# Patient Record
Sex: Female | Born: 1940
Health system: Southern US, Community
[De-identification: ages and names within clinical notes are randomized; demographics above are authoritative.]

## PROBLEM LIST (undated history)

## (undated) DIAGNOSIS — E7800 Pure hypercholesterolemia, unspecified: Secondary | ICD-10-CM

## (undated) DIAGNOSIS — Z87442 Personal history of urinary calculi: Secondary | ICD-10-CM

## (undated) DIAGNOSIS — J302 Other seasonal allergic rhinitis: Secondary | ICD-10-CM

## (undated) DIAGNOSIS — J45909 Unspecified asthma, uncomplicated: Secondary | ICD-10-CM

## (undated) DIAGNOSIS — Z9889 Other specified postprocedural states: Secondary | ICD-10-CM

## (undated) DIAGNOSIS — K589 Irritable bowel syndrome without diarrhea: Secondary | ICD-10-CM

## (undated) DIAGNOSIS — Z8489 Family history of other specified conditions: Secondary | ICD-10-CM

## (undated) DIAGNOSIS — M8580 Other specified disorders of bone density and structure, unspecified site: Secondary | ICD-10-CM

## (undated) DIAGNOSIS — I499 Cardiac arrhythmia, unspecified: Secondary | ICD-10-CM

## (undated) DIAGNOSIS — R112 Nausea with vomiting, unspecified: Secondary | ICD-10-CM

## (undated) DIAGNOSIS — D649 Anemia, unspecified: Secondary | ICD-10-CM

## (undated) DIAGNOSIS — G2581 Restless legs syndrome: Secondary | ICD-10-CM

## (undated) DIAGNOSIS — T8859XA Other complications of anesthesia, initial encounter: Secondary | ICD-10-CM

## (undated) DIAGNOSIS — M1990 Unspecified osteoarthritis, unspecified site: Secondary | ICD-10-CM

## (undated) DIAGNOSIS — G43909 Migraine, unspecified, not intractable, without status migrainosus: Secondary | ICD-10-CM

## (undated) HISTORY — PX: OTHER SURGICAL HISTORY: SHX169

## (undated) HISTORY — DX: Irritable bowel syndrome, unspecified: K58.9

## (undated) HISTORY — PX: APPENDECTOMY: SHX54

## (undated) HISTORY — DX: Pure hypercholesterolemia, unspecified: E78.00

## (undated) HISTORY — PX: EYE SURGERY: SHX253

## (undated) HISTORY — DX: Anemia, unspecified: D64.9

## (undated) HISTORY — PX: ABDOMINAL HYSTERECTOMY: SHX81

## (undated) HISTORY — DX: Migraine, unspecified, not intractable, without status migrainosus: G43.909

## (undated) HISTORY — DX: Unspecified asthma, uncomplicated: J45.909

## (undated) HISTORY — DX: Restless legs syndrome: G25.81

## (undated) HISTORY — PX: TONSILLECTOMY: SUR1361

## (undated) HISTORY — PX: CHOLECYSTECTOMY: SHX55

## (undated) HISTORY — PX: SMALL BOWEL REPAIR: SHX6447

## (undated) HISTORY — DX: Other seasonal allergic rhinitis: J30.2

## (undated) HISTORY — DX: Other specified disorders of bone density and structure, unspecified site: M85.80

---

## 2008-01-21 ENCOUNTER — Emergency Department (HOSPITAL_COMMUNITY): Admission: EM | Admit: 2008-01-21 | Discharge: 2008-01-22 | Payer: Self-pay | Admitting: Emergency Medicine

## 2008-11-14 IMAGING — CT CT HEAD W/O CM
1 series · 16 of 30 positions shown, 20 images · non-contrast
Comparison: None

CLINICAL DATA: Headaches

CT HEAD WITHOUT CONTRAST
TECHNIQUE: Contiguous axial images were obtained from the base of
the skull through the vertex without contrast.

[Series 2: brain · axial · 0.47mm/px · z∈[-155,-13]mm · 16 of 32 slices shown, 20 images]
[im 2/32  brain]
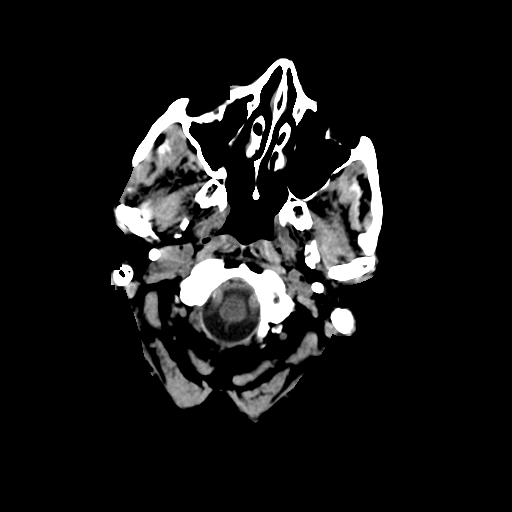
[im 2/32  bone]
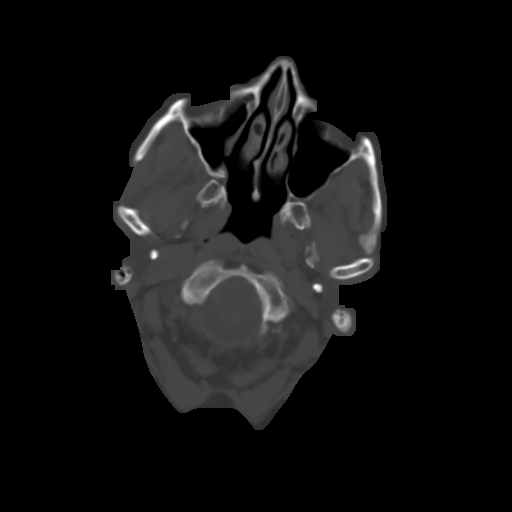
[im 4/32  brain]
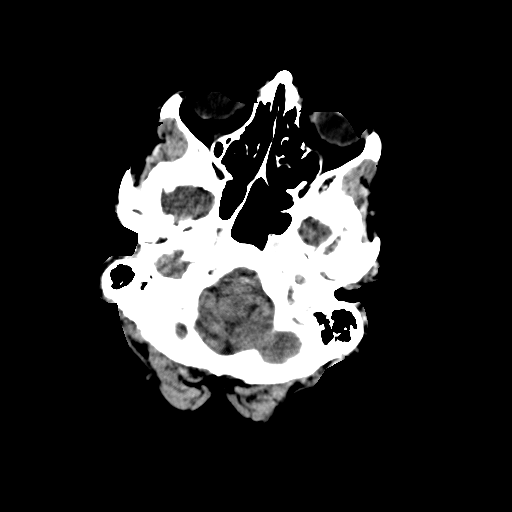
[im 6/32  brain]
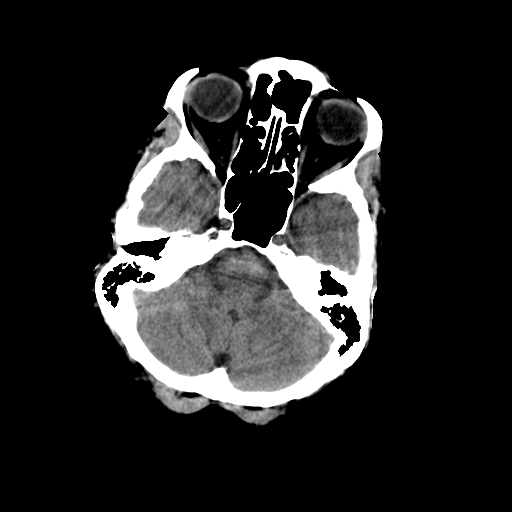
[im 8/32  brain]
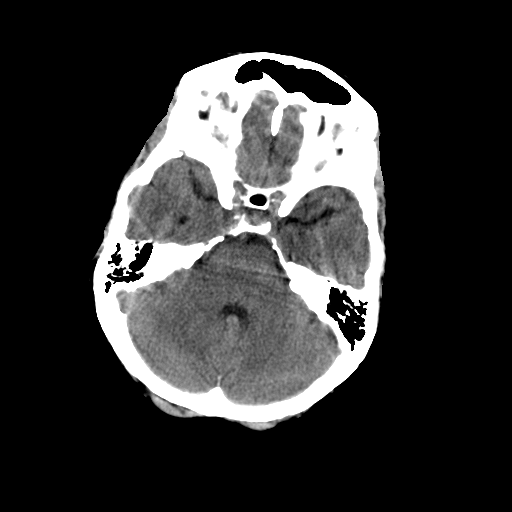
[im 9/32  brain]
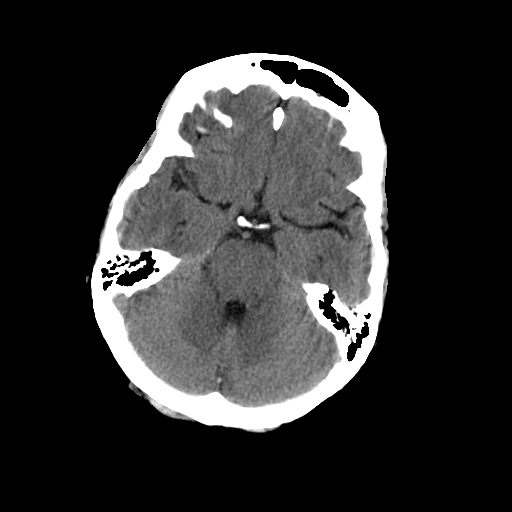
[im 9/32  bone]
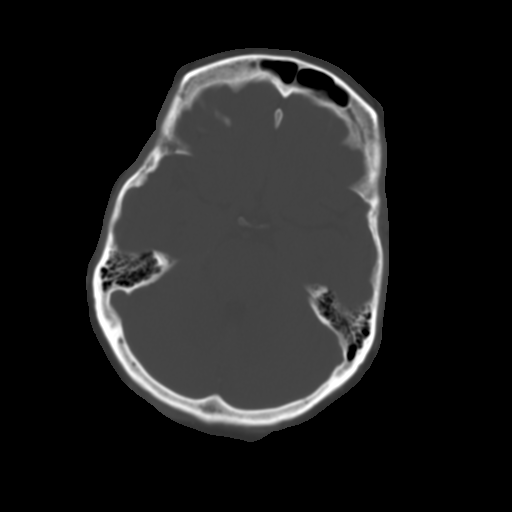
[im 11/32  brain]
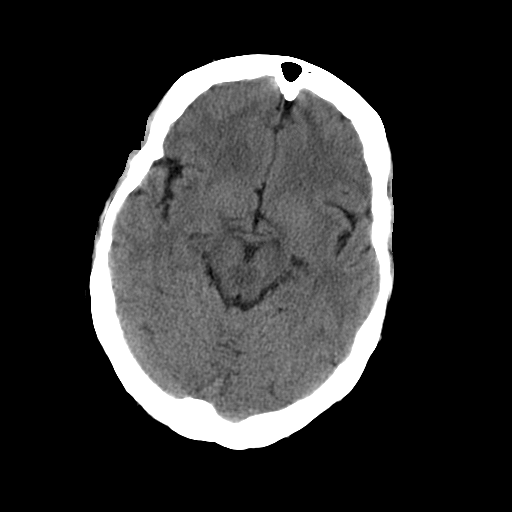
[im 13/32  brain]
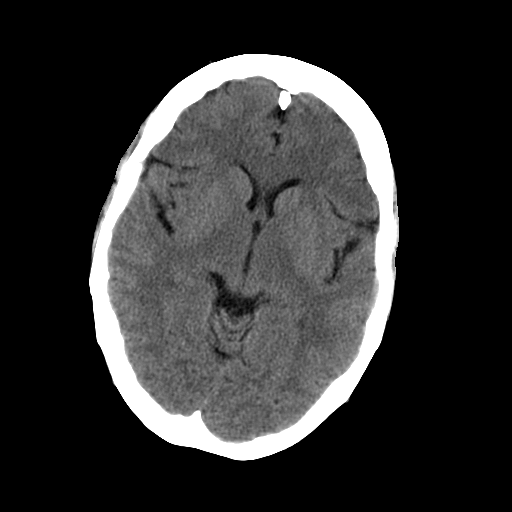
[im 15/32  brain]
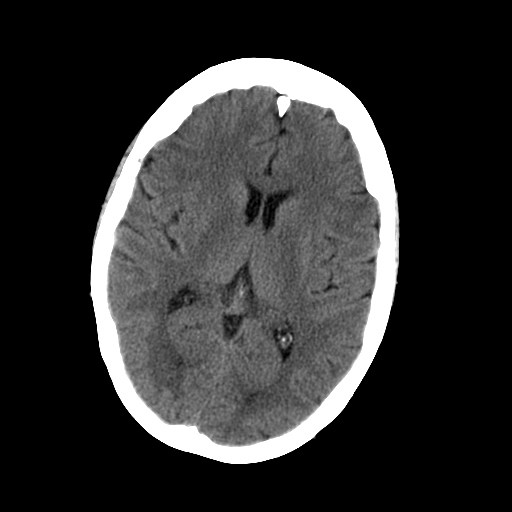
[im 17/32  brain]
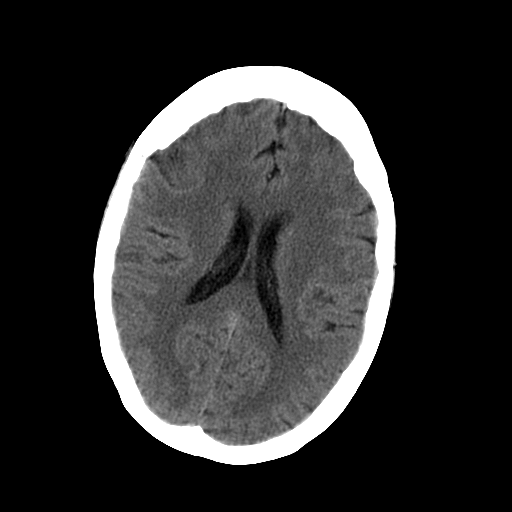
[im 17/32  bone]
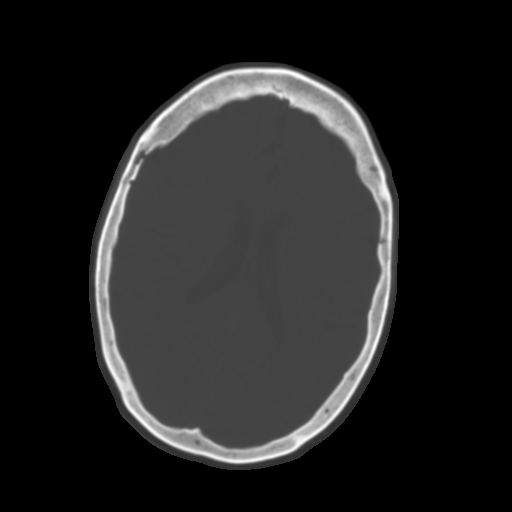
[im 19/32  brain]
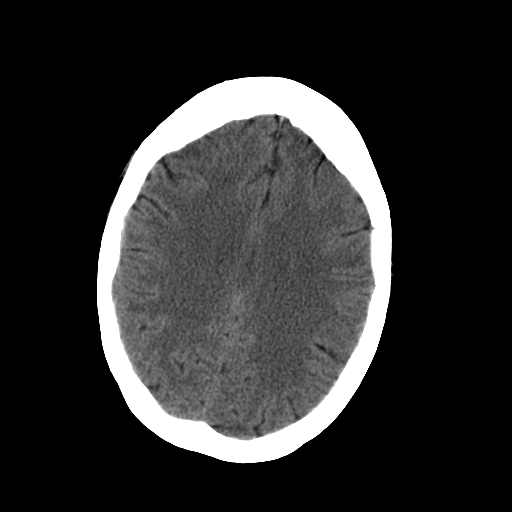
[im 21/32  brain]
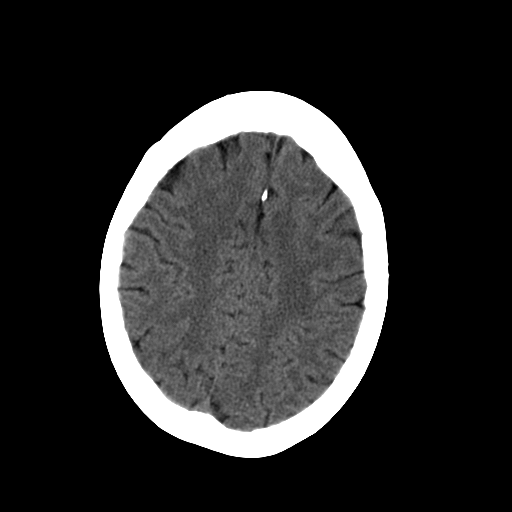
[im 23/32  brain]
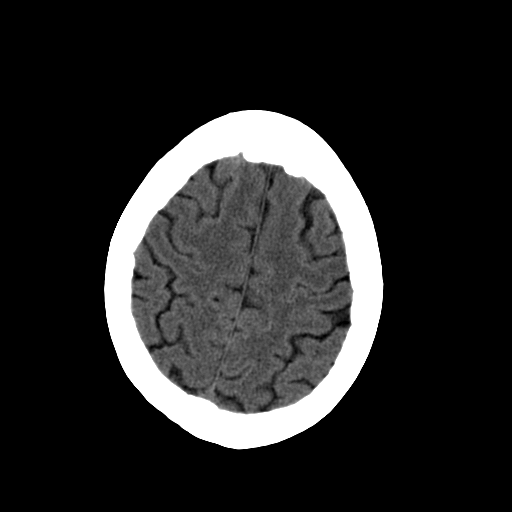
[im 24/32  brain]
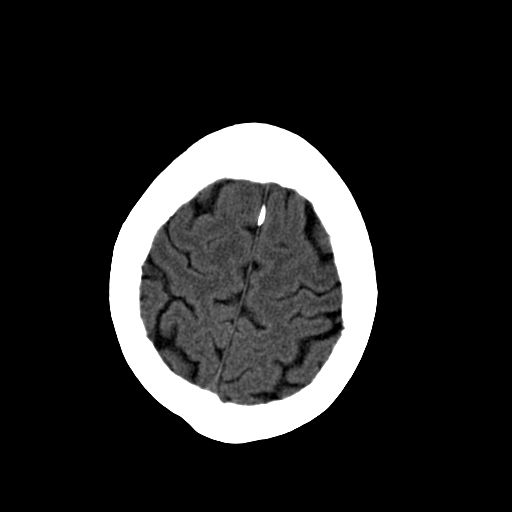
[im 24/32  bone]
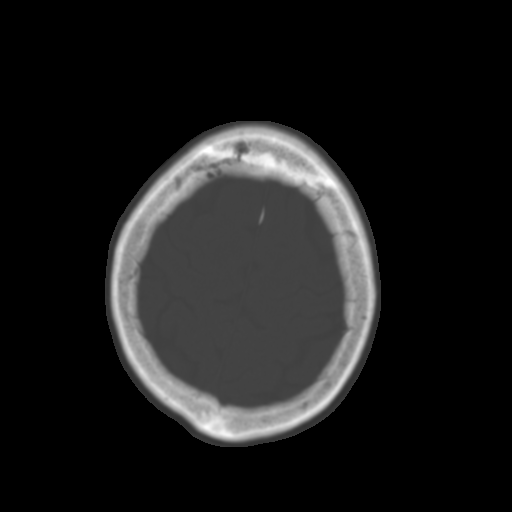
[im 26/32  brain]
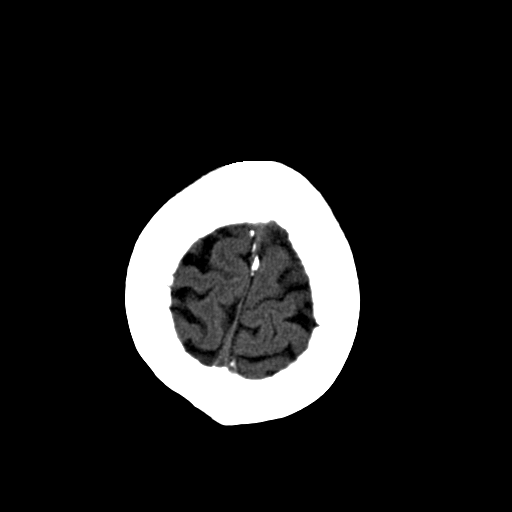
[im 28/32  brain]
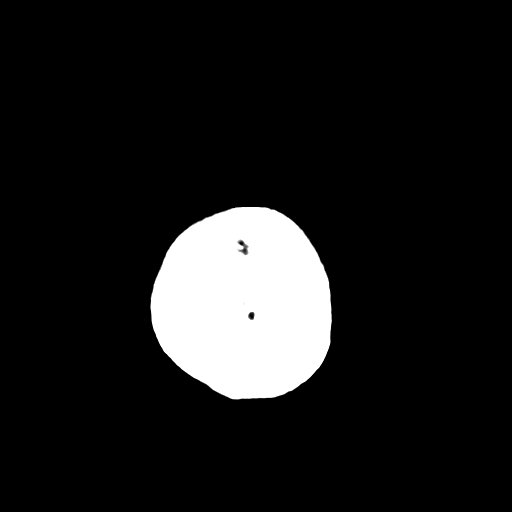
[im 30/32  brain]
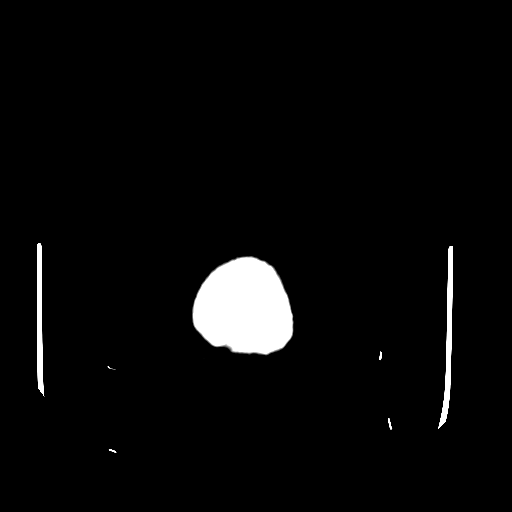

[16 of 30 positions shown; findings below may reference images not displayed]

FINDINGS: The brain has a normal appearance without evidence for
hemorrhage, infarction, hydrocephalus, or mass lesion.  There is no
extra axial fluid collection.  The skull and paranasal sinuses are
normal.
IMPRESSION: 1.  No acute intracranial abnormalities.

## 2011-02-07 LAB — COMPREHENSIVE METABOLIC PANEL
CO2: 28
Calcium: 9.2
Creatinine, Ser: 0.87
GFR calc non Af Amer: >60
Glucose, Bld: 131 — ABNORMAL HIGH

## 2011-02-07 LAB — CBC
HCT: 34.2 — ABNORMAL LOW
Hemoglobin: 11.4 — ABNORMAL LOW
MCHC: 33.3
MCV: 81.9
RBC: 4.18

## 2011-02-07 LAB — DIFFERENTIAL
Basophils Absolute: 0
Eosinophils Absolute: 0.4
Lymphocytes Relative: 39
Lymphs Abs: 2.7
Neutrophils Relative %: 46

## 2011-02-09 LAB — URINALYSIS, ROUTINE W REFLEX MICROSCOPIC
Glucose, UA: NEGATIVE
Nitrite: NEGATIVE
Protein, ur: NEGATIVE
pH: 5.5

## 2011-05-17 DIAGNOSIS — D510 Vitamin B12 deficiency anemia due to intrinsic factor deficiency: Secondary | ICD-10-CM | POA: Diagnosis not present

## 2011-06-08 DIAGNOSIS — D485 Neoplasm of uncertain behavior of skin: Secondary | ICD-10-CM | POA: Diagnosis not present

## 2011-06-08 DIAGNOSIS — L732 Hidradenitis suppurativa: Secondary | ICD-10-CM | POA: Diagnosis not present

## 2011-06-08 DIAGNOSIS — D219 Benign neoplasm of connective and other soft tissue, unspecified: Secondary | ICD-10-CM | POA: Diagnosis not present

## 2011-06-28 DIAGNOSIS — Z1231 Encounter for screening mammogram for malignant neoplasm of breast: Secondary | ICD-10-CM | POA: Diagnosis not present

## 2011-06-30 DIAGNOSIS — L723 Sebaceous cyst: Secondary | ICD-10-CM | POA: Diagnosis not present

## 2011-08-17 DIAGNOSIS — R252 Cramp and spasm: Secondary | ICD-10-CM | POA: Diagnosis not present

## 2011-08-17 DIAGNOSIS — R609 Edema, unspecified: Secondary | ICD-10-CM | POA: Diagnosis not present

## 2012-01-19 DIAGNOSIS — D1801 Hemangioma of skin and subcutaneous tissue: Secondary | ICD-10-CM | POA: Diagnosis not present

## 2012-01-19 DIAGNOSIS — L723 Sebaceous cyst: Secondary | ICD-10-CM | POA: Diagnosis not present

## 2012-01-23 DIAGNOSIS — M5030 Other cervical disc degeneration, unspecified cervical region: Secondary | ICD-10-CM | POA: Diagnosis not present

## 2012-01-23 DIAGNOSIS — M9981 Other biomechanical lesions of cervical region: Secondary | ICD-10-CM | POA: Diagnosis not present

## 2012-01-23 DIAGNOSIS — M542 Cervicalgia: Secondary | ICD-10-CM | POA: Diagnosis not present

## 2012-01-23 DIAGNOSIS — R51 Headache: Secondary | ICD-10-CM | POA: Diagnosis not present

## 2012-01-26 DIAGNOSIS — M5030 Other cervical disc degeneration, unspecified cervical region: Secondary | ICD-10-CM | POA: Diagnosis not present

## 2012-01-26 DIAGNOSIS — M9981 Other biomechanical lesions of cervical region: Secondary | ICD-10-CM | POA: Diagnosis not present

## 2012-01-26 DIAGNOSIS — M542 Cervicalgia: Secondary | ICD-10-CM | POA: Diagnosis not present

## 2012-01-26 DIAGNOSIS — R51 Headache: Secondary | ICD-10-CM | POA: Diagnosis not present

## 2012-01-30 DIAGNOSIS — R109 Unspecified abdominal pain: Secondary | ICD-10-CM | POA: Diagnosis not present

## 2012-01-31 DIAGNOSIS — K5730 Diverticulosis of large intestine without perforation or abscess without bleeding: Secondary | ICD-10-CM | POA: Diagnosis not present

## 2012-01-31 DIAGNOSIS — R109 Unspecified abdominal pain: Secondary | ICD-10-CM | POA: Diagnosis not present

## 2012-01-31 DIAGNOSIS — R634 Abnormal weight loss: Secondary | ICD-10-CM | POA: Diagnosis not present

## 2012-02-06 DIAGNOSIS — M9981 Other biomechanical lesions of cervical region: Secondary | ICD-10-CM | POA: Diagnosis not present

## 2012-02-06 DIAGNOSIS — R51 Headache: Secondary | ICD-10-CM | POA: Diagnosis not present

## 2012-02-06 DIAGNOSIS — M542 Cervicalgia: Secondary | ICD-10-CM | POA: Diagnosis not present

## 2012-02-06 DIAGNOSIS — M5030 Other cervical disc degeneration, unspecified cervical region: Secondary | ICD-10-CM | POA: Diagnosis not present

## 2012-02-14 DIAGNOSIS — R51 Headache: Secondary | ICD-10-CM | POA: Diagnosis not present

## 2012-02-14 DIAGNOSIS — M542 Cervicalgia: Secondary | ICD-10-CM | POA: Diagnosis not present

## 2012-02-14 DIAGNOSIS — M9981 Other biomechanical lesions of cervical region: Secondary | ICD-10-CM | POA: Diagnosis not present

## 2012-02-14 DIAGNOSIS — M5030 Other cervical disc degeneration, unspecified cervical region: Secondary | ICD-10-CM | POA: Diagnosis not present

## 2012-02-23 DIAGNOSIS — R51 Headache: Secondary | ICD-10-CM | POA: Diagnosis not present

## 2012-02-23 DIAGNOSIS — M542 Cervicalgia: Secondary | ICD-10-CM | POA: Diagnosis not present

## 2012-02-23 DIAGNOSIS — M5030 Other cervical disc degeneration, unspecified cervical region: Secondary | ICD-10-CM | POA: Diagnosis not present

## 2012-02-23 DIAGNOSIS — M9981 Other biomechanical lesions of cervical region: Secondary | ICD-10-CM | POA: Diagnosis not present

## 2012-03-05 DIAGNOSIS — M9981 Other biomechanical lesions of cervical region: Secondary | ICD-10-CM | POA: Diagnosis not present

## 2012-03-05 DIAGNOSIS — R51 Headache: Secondary | ICD-10-CM | POA: Diagnosis not present

## 2012-03-05 DIAGNOSIS — M542 Cervicalgia: Secondary | ICD-10-CM | POA: Diagnosis not present

## 2012-03-05 DIAGNOSIS — M5030 Other cervical disc degeneration, unspecified cervical region: Secondary | ICD-10-CM | POA: Diagnosis not present

## 2012-03-15 DIAGNOSIS — M9981 Other biomechanical lesions of cervical region: Secondary | ICD-10-CM | POA: Diagnosis not present

## 2012-03-15 DIAGNOSIS — R51 Headache: Secondary | ICD-10-CM | POA: Diagnosis not present

## 2012-03-15 DIAGNOSIS — M542 Cervicalgia: Secondary | ICD-10-CM | POA: Diagnosis not present

## 2012-03-15 DIAGNOSIS — M5030 Other cervical disc degeneration, unspecified cervical region: Secondary | ICD-10-CM | POA: Diagnosis not present

## 2012-03-27 DIAGNOSIS — M5030 Other cervical disc degeneration, unspecified cervical region: Secondary | ICD-10-CM | POA: Diagnosis not present

## 2012-03-27 DIAGNOSIS — M9981 Other biomechanical lesions of cervical region: Secondary | ICD-10-CM | POA: Diagnosis not present

## 2012-03-27 DIAGNOSIS — M542 Cervicalgia: Secondary | ICD-10-CM | POA: Diagnosis not present

## 2012-03-27 DIAGNOSIS — R51 Headache: Secondary | ICD-10-CM | POA: Diagnosis not present

## 2012-04-09 DIAGNOSIS — M5030 Other cervical disc degeneration, unspecified cervical region: Secondary | ICD-10-CM | POA: Diagnosis not present

## 2012-04-09 DIAGNOSIS — R51 Headache: Secondary | ICD-10-CM | POA: Diagnosis not present

## 2012-04-09 DIAGNOSIS — M9981 Other biomechanical lesions of cervical region: Secondary | ICD-10-CM | POA: Diagnosis not present

## 2012-04-09 DIAGNOSIS — M542 Cervicalgia: Secondary | ICD-10-CM | POA: Diagnosis not present

## 2012-04-17 DIAGNOSIS — M9981 Other biomechanical lesions of cervical region: Secondary | ICD-10-CM | POA: Diagnosis not present

## 2012-04-17 DIAGNOSIS — R51 Headache: Secondary | ICD-10-CM | POA: Diagnosis not present

## 2012-04-17 DIAGNOSIS — M5030 Other cervical disc degeneration, unspecified cervical region: Secondary | ICD-10-CM | POA: Diagnosis not present

## 2012-04-17 DIAGNOSIS — M542 Cervicalgia: Secondary | ICD-10-CM | POA: Diagnosis not present

## 2012-04-18 DIAGNOSIS — N3946 Mixed incontinence: Secondary | ICD-10-CM | POA: Diagnosis not present

## 2012-04-18 DIAGNOSIS — N993 Prolapse of vaginal vault after hysterectomy: Secondary | ICD-10-CM | POA: Diagnosis not present

## 2012-04-24 DIAGNOSIS — M9981 Other biomechanical lesions of cervical region: Secondary | ICD-10-CM | POA: Diagnosis not present

## 2012-04-24 DIAGNOSIS — M542 Cervicalgia: Secondary | ICD-10-CM | POA: Diagnosis not present

## 2012-04-24 DIAGNOSIS — R51 Headache: Secondary | ICD-10-CM | POA: Diagnosis not present

## 2012-04-24 DIAGNOSIS — M5030 Other cervical disc degeneration, unspecified cervical region: Secondary | ICD-10-CM | POA: Diagnosis not present

## 2012-05-14 DIAGNOSIS — M542 Cervicalgia: Secondary | ICD-10-CM | POA: Diagnosis not present

## 2012-05-14 DIAGNOSIS — M9981 Other biomechanical lesions of cervical region: Secondary | ICD-10-CM | POA: Diagnosis not present

## 2012-05-14 DIAGNOSIS — R51 Headache: Secondary | ICD-10-CM | POA: Diagnosis not present

## 2012-05-14 DIAGNOSIS — M5030 Other cervical disc degeneration, unspecified cervical region: Secondary | ICD-10-CM | POA: Diagnosis not present

## 2012-05-17 DIAGNOSIS — N3941 Urge incontinence: Secondary | ICD-10-CM | POA: Diagnosis not present

## 2012-05-17 DIAGNOSIS — N993 Prolapse of vaginal vault after hysterectomy: Secondary | ICD-10-CM | POA: Diagnosis not present

## 2012-05-17 DIAGNOSIS — N8111 Cystocele, midline: Secondary | ICD-10-CM | POA: Diagnosis not present

## 2012-05-17 DIAGNOSIS — N816 Rectocele: Secondary | ICD-10-CM | POA: Diagnosis not present

## 2012-05-17 DIAGNOSIS — K5900 Constipation, unspecified: Secondary | ICD-10-CM | POA: Diagnosis not present

## 2012-05-17 DIAGNOSIS — N952 Postmenopausal atrophic vaginitis: Secondary | ICD-10-CM | POA: Diagnosis not present

## 2012-05-21 DIAGNOSIS — M9981 Other biomechanical lesions of cervical region: Secondary | ICD-10-CM | POA: Diagnosis not present

## 2012-05-21 DIAGNOSIS — R51 Headache: Secondary | ICD-10-CM | POA: Diagnosis not present

## 2012-05-21 DIAGNOSIS — M5030 Other cervical disc degeneration, unspecified cervical region: Secondary | ICD-10-CM | POA: Diagnosis not present

## 2012-05-21 DIAGNOSIS — M542 Cervicalgia: Secondary | ICD-10-CM | POA: Diagnosis not present

## 2012-05-31 DIAGNOSIS — N8111 Cystocele, midline: Secondary | ICD-10-CM | POA: Diagnosis not present

## 2012-05-31 DIAGNOSIS — N3941 Urge incontinence: Secondary | ICD-10-CM | POA: Diagnosis not present

## 2012-06-01 DIAGNOSIS — M9981 Other biomechanical lesions of cervical region: Secondary | ICD-10-CM | POA: Diagnosis not present

## 2012-06-01 DIAGNOSIS — M542 Cervicalgia: Secondary | ICD-10-CM | POA: Diagnosis not present

## 2012-06-01 DIAGNOSIS — R51 Headache: Secondary | ICD-10-CM | POA: Diagnosis not present

## 2012-06-01 DIAGNOSIS — M5030 Other cervical disc degeneration, unspecified cervical region: Secondary | ICD-10-CM | POA: Diagnosis not present

## 2012-06-04 DIAGNOSIS — N993 Prolapse of vaginal vault after hysterectomy: Secondary | ICD-10-CM | POA: Diagnosis not present

## 2012-06-04 DIAGNOSIS — N8111 Cystocele, midline: Secondary | ICD-10-CM | POA: Diagnosis not present

## 2012-06-11 DIAGNOSIS — R51 Headache: Secondary | ICD-10-CM | POA: Diagnosis not present

## 2012-06-11 DIAGNOSIS — M9981 Other biomechanical lesions of cervical region: Secondary | ICD-10-CM | POA: Diagnosis not present

## 2012-06-11 DIAGNOSIS — M5030 Other cervical disc degeneration, unspecified cervical region: Secondary | ICD-10-CM | POA: Diagnosis not present

## 2012-06-11 DIAGNOSIS — M542 Cervicalgia: Secondary | ICD-10-CM | POA: Diagnosis not present

## 2012-07-26 DIAGNOSIS — Z01811 Encounter for preprocedural respiratory examination: Secondary | ICD-10-CM | POA: Diagnosis not present

## 2012-07-26 DIAGNOSIS — K5732 Diverticulitis of large intestine without perforation or abscess without bleeding: Secondary | ICD-10-CM | POA: Diagnosis not present

## 2012-07-26 DIAGNOSIS — N816 Rectocele: Secondary | ICD-10-CM | POA: Diagnosis not present

## 2012-07-26 DIAGNOSIS — N993 Prolapse of vaginal vault after hysterectomy: Secondary | ICD-10-CM | POA: Diagnosis not present

## 2012-07-26 DIAGNOSIS — R918 Other nonspecific abnormal finding of lung field: Secondary | ICD-10-CM | POA: Diagnosis not present

## 2012-07-30 DIAGNOSIS — N993 Prolapse of vaginal vault after hysterectomy: Secondary | ICD-10-CM | POA: Diagnosis not present

## 2012-07-30 DIAGNOSIS — N3941 Urge incontinence: Secondary | ICD-10-CM | POA: Diagnosis not present

## 2012-07-30 DIAGNOSIS — N815 Vaginal enterocele: Secondary | ICD-10-CM | POA: Diagnosis not present

## 2012-07-30 DIAGNOSIS — N8110 Cystocele, unspecified: Secondary | ICD-10-CM | POA: Diagnosis not present

## 2012-07-30 DIAGNOSIS — Z882 Allergy status to sulfonamides status: Secondary | ICD-10-CM | POA: Diagnosis not present

## 2012-08-03 DIAGNOSIS — N3941 Urge incontinence: Secondary | ICD-10-CM | POA: Diagnosis not present

## 2012-09-14 DIAGNOSIS — N816 Rectocele: Secondary | ICD-10-CM

## 2012-09-14 DIAGNOSIS — IMO0002 Reserved for concepts with insufficient information to code with codable children: Secondary | ICD-10-CM | POA: Insufficient documentation

## 2012-09-14 HISTORY — DX: Rectocele: N81.6

## 2012-10-08 DIAGNOSIS — M5030 Other cervical disc degeneration, unspecified cervical region: Secondary | ICD-10-CM | POA: Diagnosis not present

## 2012-10-08 DIAGNOSIS — R51 Headache: Secondary | ICD-10-CM | POA: Diagnosis not present

## 2012-10-08 DIAGNOSIS — M9981 Other biomechanical lesions of cervical region: Secondary | ICD-10-CM | POA: Diagnosis not present

## 2012-10-08 DIAGNOSIS — M542 Cervicalgia: Secondary | ICD-10-CM | POA: Diagnosis not present

## 2012-11-05 DIAGNOSIS — M9981 Other biomechanical lesions of cervical region: Secondary | ICD-10-CM | POA: Diagnosis not present

## 2012-11-05 DIAGNOSIS — M5030 Other cervical disc degeneration, unspecified cervical region: Secondary | ICD-10-CM | POA: Diagnosis not present

## 2012-11-05 DIAGNOSIS — R51 Headache: Secondary | ICD-10-CM | POA: Diagnosis not present

## 2012-11-05 DIAGNOSIS — M542 Cervicalgia: Secondary | ICD-10-CM | POA: Diagnosis not present

## 2012-12-03 DIAGNOSIS — M542 Cervicalgia: Secondary | ICD-10-CM | POA: Diagnosis not present

## 2012-12-03 DIAGNOSIS — R51 Headache: Secondary | ICD-10-CM | POA: Diagnosis not present

## 2012-12-03 DIAGNOSIS — M9981 Other biomechanical lesions of cervical region: Secondary | ICD-10-CM | POA: Diagnosis not present

## 2012-12-03 DIAGNOSIS — M5030 Other cervical disc degeneration, unspecified cervical region: Secondary | ICD-10-CM | POA: Diagnosis not present

## 2013-01-28 DIAGNOSIS — M9981 Other biomechanical lesions of cervical region: Secondary | ICD-10-CM | POA: Diagnosis not present

## 2013-01-28 DIAGNOSIS — R51 Headache: Secondary | ICD-10-CM | POA: Diagnosis not present

## 2013-01-28 DIAGNOSIS — M5030 Other cervical disc degeneration, unspecified cervical region: Secondary | ICD-10-CM | POA: Diagnosis not present

## 2013-01-28 DIAGNOSIS — M542 Cervicalgia: Secondary | ICD-10-CM | POA: Diagnosis not present

## 2013-03-04 DIAGNOSIS — R51 Headache: Secondary | ICD-10-CM | POA: Diagnosis not present

## 2013-03-04 DIAGNOSIS — M9981 Other biomechanical lesions of cervical region: Secondary | ICD-10-CM | POA: Diagnosis not present

## 2013-03-04 DIAGNOSIS — M542 Cervicalgia: Secondary | ICD-10-CM | POA: Diagnosis not present

## 2013-03-04 DIAGNOSIS — M5030 Other cervical disc degeneration, unspecified cervical region: Secondary | ICD-10-CM | POA: Diagnosis not present

## 2013-03-27 DIAGNOSIS — Z23 Encounter for immunization: Secondary | ICD-10-CM | POA: Diagnosis not present

## 2013-03-27 DIAGNOSIS — R300 Dysuria: Secondary | ICD-10-CM | POA: Diagnosis not present

## 2013-03-27 DIAGNOSIS — R358 Other polyuria: Secondary | ICD-10-CM | POA: Diagnosis not present

## 2013-04-15 DIAGNOSIS — M9981 Other biomechanical lesions of cervical region: Secondary | ICD-10-CM | POA: Diagnosis not present

## 2013-04-15 DIAGNOSIS — M542 Cervicalgia: Secondary | ICD-10-CM | POA: Diagnosis not present

## 2013-04-15 DIAGNOSIS — R51 Headache: Secondary | ICD-10-CM | POA: Diagnosis not present

## 2013-04-15 DIAGNOSIS — M5030 Other cervical disc degeneration, unspecified cervical region: Secondary | ICD-10-CM | POA: Diagnosis not present

## 2013-05-09 DIAGNOSIS — R059 Cough, unspecified: Secondary | ICD-10-CM | POA: Diagnosis not present

## 2013-05-09 DIAGNOSIS — R05 Cough: Secondary | ICD-10-CM | POA: Diagnosis not present

## 2013-05-09 DIAGNOSIS — J0100 Acute maxillary sinusitis, unspecified: Secondary | ICD-10-CM | POA: Diagnosis not present

## 2013-06-25 DIAGNOSIS — S8990XA Unspecified injury of unspecified lower leg, initial encounter: Secondary | ICD-10-CM | POA: Diagnosis not present

## 2013-06-25 DIAGNOSIS — S99919A Unspecified injury of unspecified ankle, initial encounter: Secondary | ICD-10-CM | POA: Diagnosis not present

## 2013-06-25 DIAGNOSIS — M79609 Pain in unspecified limb: Secondary | ICD-10-CM | POA: Diagnosis not present

## 2013-06-25 DIAGNOSIS — M25473 Effusion, unspecified ankle: Secondary | ICD-10-CM | POA: Diagnosis not present

## 2013-06-25 DIAGNOSIS — W010XXA Fall on same level from slipping, tripping and stumbling without subsequent striking against object, initial encounter: Secondary | ICD-10-CM | POA: Diagnosis not present

## 2013-06-25 DIAGNOSIS — M25476 Effusion, unspecified foot: Secondary | ICD-10-CM | POA: Diagnosis not present

## 2013-06-26 DIAGNOSIS — S93409A Sprain of unspecified ligament of unspecified ankle, initial encounter: Secondary | ICD-10-CM | POA: Diagnosis not present

## 2013-06-26 DIAGNOSIS — M25579 Pain in unspecified ankle and joints of unspecified foot: Secondary | ICD-10-CM | POA: Diagnosis not present

## 2013-09-02 DIAGNOSIS — M5030 Other cervical disc degeneration, unspecified cervical region: Secondary | ICD-10-CM | POA: Diagnosis not present

## 2013-09-02 DIAGNOSIS — M9981 Other biomechanical lesions of cervical region: Secondary | ICD-10-CM | POA: Diagnosis not present

## 2013-09-02 DIAGNOSIS — R51 Headache: Secondary | ICD-10-CM | POA: Diagnosis not present

## 2013-09-02 DIAGNOSIS — M542 Cervicalgia: Secondary | ICD-10-CM | POA: Diagnosis not present

## 2013-10-21 DIAGNOSIS — N8111 Cystocele, midline: Secondary | ICD-10-CM | POA: Diagnosis not present

## 2014-02-11 DIAGNOSIS — H25813 Combined forms of age-related cataract, bilateral: Secondary | ICD-10-CM | POA: Diagnosis not present

## 2014-05-12 DIAGNOSIS — H52223 Regular astigmatism, bilateral: Secondary | ICD-10-CM | POA: Diagnosis not present

## 2014-05-12 DIAGNOSIS — H25813 Combined forms of age-related cataract, bilateral: Secondary | ICD-10-CM | POA: Diagnosis not present

## 2014-05-12 DIAGNOSIS — H35361 Drusen (degenerative) of macula, right eye: Secondary | ICD-10-CM | POA: Diagnosis not present

## 2014-05-12 DIAGNOSIS — H524 Presbyopia: Secondary | ICD-10-CM | POA: Diagnosis not present

## 2014-06-23 DIAGNOSIS — H109 Unspecified conjunctivitis: Secondary | ICD-10-CM | POA: Diagnosis not present

## 2014-06-23 DIAGNOSIS — H209 Unspecified iridocyclitis: Secondary | ICD-10-CM | POA: Diagnosis not present

## 2014-07-14 DIAGNOSIS — E780 Pure hypercholesterolemia: Secondary | ICD-10-CM | POA: Diagnosis not present

## 2014-07-14 DIAGNOSIS — R5381 Other malaise: Secondary | ICD-10-CM | POA: Diagnosis not present

## 2014-07-14 DIAGNOSIS — R0602 Shortness of breath: Secondary | ICD-10-CM | POA: Diagnosis not present

## 2014-07-14 DIAGNOSIS — D509 Iron deficiency anemia, unspecified: Secondary | ICD-10-CM | POA: Diagnosis not present

## 2014-07-14 DIAGNOSIS — R079 Chest pain, unspecified: Secondary | ICD-10-CM | POA: Diagnosis not present

## 2014-07-14 DIAGNOSIS — Z79899 Other long term (current) drug therapy: Secondary | ICD-10-CM | POA: Diagnosis not present

## 2014-07-15 DIAGNOSIS — R079 Chest pain, unspecified: Secondary | ICD-10-CM | POA: Diagnosis not present

## 2014-07-17 DIAGNOSIS — D510 Vitamin B12 deficiency anemia due to intrinsic factor deficiency: Secondary | ICD-10-CM | POA: Diagnosis not present

## 2014-07-22 DIAGNOSIS — R0602 Shortness of breath: Secondary | ICD-10-CM | POA: Diagnosis not present

## 2014-07-22 DIAGNOSIS — R079 Chest pain, unspecified: Secondary | ICD-10-CM | POA: Diagnosis not present

## 2014-07-24 DIAGNOSIS — D510 Vitamin B12 deficiency anemia due to intrinsic factor deficiency: Secondary | ICD-10-CM | POA: Diagnosis not present

## 2014-07-31 DIAGNOSIS — D510 Vitamin B12 deficiency anemia due to intrinsic factor deficiency: Secondary | ICD-10-CM | POA: Diagnosis not present

## 2014-08-15 DIAGNOSIS — D510 Vitamin B12 deficiency anemia due to intrinsic factor deficiency: Secondary | ICD-10-CM | POA: Diagnosis not present

## 2014-09-10 DIAGNOSIS — J4540 Moderate persistent asthma, uncomplicated: Secondary | ICD-10-CM | POA: Diagnosis not present

## 2014-09-10 DIAGNOSIS — J309 Allergic rhinitis, unspecified: Secondary | ICD-10-CM | POA: Diagnosis not present

## 2014-09-30 DIAGNOSIS — H905 Unspecified sensorineural hearing loss: Secondary | ICD-10-CM | POA: Diagnosis not present

## 2014-09-30 DIAGNOSIS — R42 Dizziness and giddiness: Secondary | ICD-10-CM | POA: Diagnosis not present

## 2014-09-30 DIAGNOSIS — D510 Vitamin B12 deficiency anemia due to intrinsic factor deficiency: Secondary | ICD-10-CM | POA: Diagnosis not present

## 2014-09-30 DIAGNOSIS — H9313 Tinnitus, bilateral: Secondary | ICD-10-CM | POA: Diagnosis not present

## 2014-09-30 DIAGNOSIS — H903 Sensorineural hearing loss, bilateral: Secondary | ICD-10-CM | POA: Diagnosis not present

## 2014-10-02 DIAGNOSIS — J4540 Moderate persistent asthma, uncomplicated: Secondary | ICD-10-CM | POA: Diagnosis not present

## 2014-10-02 DIAGNOSIS — J309 Allergic rhinitis, unspecified: Secondary | ICD-10-CM | POA: Diagnosis not present

## 2014-11-17 DIAGNOSIS — D510 Vitamin B12 deficiency anemia due to intrinsic factor deficiency: Secondary | ICD-10-CM | POA: Diagnosis not present

## 2014-12-04 DIAGNOSIS — J4540 Moderate persistent asthma, uncomplicated: Secondary | ICD-10-CM | POA: Diagnosis not present

## 2014-12-04 DIAGNOSIS — J309 Allergic rhinitis, unspecified: Secondary | ICD-10-CM | POA: Diagnosis not present

## 2015-02-06 DIAGNOSIS — D510 Vitamin B12 deficiency anemia due to intrinsic factor deficiency: Secondary | ICD-10-CM | POA: Diagnosis not present

## 2015-04-01 DIAGNOSIS — K589 Irritable bowel syndrome without diarrhea: Secondary | ICD-10-CM | POA: Diagnosis not present

## 2015-04-01 DIAGNOSIS — E538 Deficiency of other specified B group vitamins: Secondary | ICD-10-CM | POA: Diagnosis not present

## 2015-04-01 DIAGNOSIS — Z23 Encounter for immunization: Secondary | ICD-10-CM | POA: Diagnosis not present

## 2015-04-01 DIAGNOSIS — J302 Other seasonal allergic rhinitis: Secondary | ICD-10-CM | POA: Diagnosis not present

## 2015-06-26 DIAGNOSIS — M8580 Other specified disorders of bone density and structure, unspecified site: Secondary | ICD-10-CM | POA: Diagnosis not present

## 2015-06-26 DIAGNOSIS — E7800 Pure hypercholesterolemia, unspecified: Secondary | ICD-10-CM | POA: Diagnosis not present

## 2015-06-26 DIAGNOSIS — Z0000 Encounter for general adult medical examination without abnormal findings: Secondary | ICD-10-CM | POA: Diagnosis not present

## 2015-06-26 DIAGNOSIS — G2581 Restless legs syndrome: Secondary | ICD-10-CM | POA: Diagnosis not present

## 2015-06-26 DIAGNOSIS — D649 Anemia, unspecified: Secondary | ICD-10-CM | POA: Diagnosis not present

## 2015-06-26 DIAGNOSIS — J302 Other seasonal allergic rhinitis: Secondary | ICD-10-CM | POA: Diagnosis not present

## 2015-06-26 DIAGNOSIS — E559 Vitamin D deficiency, unspecified: Secondary | ICD-10-CM | POA: Diagnosis not present

## 2015-06-26 DIAGNOSIS — G43109 Migraine with aura, not intractable, without status migrainosus: Secondary | ICD-10-CM | POA: Diagnosis not present

## 2015-06-26 DIAGNOSIS — Z1389 Encounter for screening for other disorder: Secondary | ICD-10-CM | POA: Diagnosis not present

## 2015-06-26 DIAGNOSIS — K581 Irritable bowel syndrome with constipation: Secondary | ICD-10-CM | POA: Diagnosis not present

## 2015-06-26 DIAGNOSIS — E538 Deficiency of other specified B group vitamins: Secondary | ICD-10-CM | POA: Diagnosis not present

## 2015-07-03 DIAGNOSIS — Z1231 Encounter for screening mammogram for malignant neoplasm of breast: Secondary | ICD-10-CM | POA: Diagnosis not present

## 2015-07-03 DIAGNOSIS — M810 Age-related osteoporosis without current pathological fracture: Secondary | ICD-10-CM | POA: Diagnosis not present

## 2015-07-23 DIAGNOSIS — M810 Age-related osteoporosis without current pathological fracture: Secondary | ICD-10-CM | POA: Diagnosis not present

## 2015-09-28 DIAGNOSIS — M810 Age-related osteoporosis without current pathological fracture: Secondary | ICD-10-CM | POA: Diagnosis not present

## 2015-09-28 DIAGNOSIS — E559 Vitamin D deficiency, unspecified: Secondary | ICD-10-CM | POA: Diagnosis not present

## 2015-09-28 DIAGNOSIS — E7800 Pure hypercholesterolemia, unspecified: Secondary | ICD-10-CM | POA: Diagnosis not present

## 2015-09-28 DIAGNOSIS — D510 Vitamin B12 deficiency anemia due to intrinsic factor deficiency: Secondary | ICD-10-CM | POA: Diagnosis not present

## 2015-09-30 DIAGNOSIS — E7800 Pure hypercholesterolemia, unspecified: Secondary | ICD-10-CM | POA: Diagnosis not present

## 2016-01-04 DIAGNOSIS — D510 Vitamin B12 deficiency anemia due to intrinsic factor deficiency: Secondary | ICD-10-CM | POA: Diagnosis not present

## 2016-01-04 DIAGNOSIS — Z23 Encounter for immunization: Secondary | ICD-10-CM | POA: Diagnosis not present

## 2016-01-04 DIAGNOSIS — E7800 Pure hypercholesterolemia, unspecified: Secondary | ICD-10-CM | POA: Diagnosis not present

## 2016-02-01 DIAGNOSIS — R51 Headache: Secondary | ICD-10-CM | POA: Diagnosis not present

## 2016-05-31 DIAGNOSIS — L821 Other seborrheic keratosis: Secondary | ICD-10-CM | POA: Diagnosis not present

## 2016-05-31 DIAGNOSIS — L439 Lichen planus, unspecified: Secondary | ICD-10-CM | POA: Diagnosis not present

## 2016-05-31 DIAGNOSIS — L02224 Furuncle of groin: Secondary | ICD-10-CM | POA: Diagnosis not present

## 2016-05-31 DIAGNOSIS — L732 Hidradenitis suppurativa: Secondary | ICD-10-CM | POA: Diagnosis not present

## 2016-07-12 DIAGNOSIS — L02224 Furuncle of groin: Secondary | ICD-10-CM | POA: Diagnosis not present

## 2016-07-12 DIAGNOSIS — L439 Lichen planus, unspecified: Secondary | ICD-10-CM | POA: Diagnosis not present

## 2016-09-15 DIAGNOSIS — Z1231 Encounter for screening mammogram for malignant neoplasm of breast: Secondary | ICD-10-CM | POA: Diagnosis not present

## 2016-10-10 DIAGNOSIS — E559 Vitamin D deficiency, unspecified: Secondary | ICD-10-CM | POA: Diagnosis not present

## 2016-10-10 DIAGNOSIS — L439 Lichen planus, unspecified: Secondary | ICD-10-CM | POA: Diagnosis not present

## 2017-04-09 DIAGNOSIS — L03011 Cellulitis of right finger: Secondary | ICD-10-CM | POA: Diagnosis not present

## 2017-04-09 DIAGNOSIS — I160 Hypertensive urgency: Secondary | ICD-10-CM | POA: Diagnosis not present

## 2017-04-09 DIAGNOSIS — B349 Viral infection, unspecified: Secondary | ICD-10-CM | POA: Diagnosis not present

## 2017-07-09 DIAGNOSIS — R202 Paresthesia of skin: Secondary | ICD-10-CM | POA: Diagnosis not present

## 2017-07-09 DIAGNOSIS — I160 Hypertensive urgency: Secondary | ICD-10-CM | POA: Diagnosis not present

## 2017-07-09 DIAGNOSIS — E7800 Pure hypercholesterolemia, unspecified: Secondary | ICD-10-CM | POA: Diagnosis not present

## 2017-07-09 DIAGNOSIS — R51 Headache: Secondary | ICD-10-CM | POA: Diagnosis not present

## 2017-07-09 DIAGNOSIS — Z882 Allergy status to sulfonamides status: Secondary | ICD-10-CM | POA: Diagnosis not present

## 2017-07-09 DIAGNOSIS — R7303 Prediabetes: Secondary | ICD-10-CM | POA: Diagnosis not present

## 2017-07-09 DIAGNOSIS — J45909 Unspecified asthma, uncomplicated: Secondary | ICD-10-CM | POA: Diagnosis not present

## 2017-07-09 DIAGNOSIS — M542 Cervicalgia: Secondary | ICD-10-CM | POA: Diagnosis not present

## 2017-07-09 DIAGNOSIS — J9811 Atelectasis: Secondary | ICD-10-CM | POA: Diagnosis not present

## 2017-07-09 DIAGNOSIS — I10 Essential (primary) hypertension: Secondary | ICD-10-CM | POA: Diagnosis not present

## 2017-07-10 DIAGNOSIS — I10 Essential (primary) hypertension: Secondary | ICD-10-CM | POA: Diagnosis not present

## 2017-07-10 DIAGNOSIS — R531 Weakness: Secondary | ICD-10-CM | POA: Diagnosis not present

## 2017-07-10 DIAGNOSIS — R202 Paresthesia of skin: Secondary | ICD-10-CM | POA: Diagnosis not present

## 2017-07-12 DIAGNOSIS — R030 Elevated blood-pressure reading, without diagnosis of hypertension: Secondary | ICD-10-CM | POA: Diagnosis not present

## 2017-07-12 DIAGNOSIS — E538 Deficiency of other specified B group vitamins: Secondary | ICD-10-CM | POA: Diagnosis not present

## 2017-07-13 DIAGNOSIS — H25813 Combined forms of age-related cataract, bilateral: Secondary | ICD-10-CM | POA: Diagnosis not present

## 2017-09-12 DIAGNOSIS — Z01818 Encounter for other preprocedural examination: Secondary | ICD-10-CM | POA: Diagnosis not present

## 2017-09-12 DIAGNOSIS — H2511 Age-related nuclear cataract, right eye: Secondary | ICD-10-CM | POA: Diagnosis not present

## 2017-09-19 DIAGNOSIS — D649 Anemia, unspecified: Secondary | ICD-10-CM | POA: Diagnosis not present

## 2017-09-19 DIAGNOSIS — H25811 Combined forms of age-related cataract, right eye: Secondary | ICD-10-CM | POA: Diagnosis not present

## 2017-09-19 DIAGNOSIS — H2511 Age-related nuclear cataract, right eye: Secondary | ICD-10-CM | POA: Diagnosis not present

## 2017-10-16 DIAGNOSIS — D649 Anemia, unspecified: Secondary | ICD-10-CM | POA: Diagnosis not present

## 2017-10-16 DIAGNOSIS — Z0000 Encounter for general adult medical examination without abnormal findings: Secondary | ICD-10-CM | POA: Diagnosis not present

## 2017-10-16 DIAGNOSIS — J4520 Mild intermittent asthma, uncomplicated: Secondary | ICD-10-CM | POA: Diagnosis not present

## 2017-10-16 DIAGNOSIS — D519 Vitamin B12 deficiency anemia, unspecified: Secondary | ICD-10-CM | POA: Diagnosis not present

## 2017-10-16 DIAGNOSIS — E559 Vitamin D deficiency, unspecified: Secondary | ICD-10-CM | POA: Diagnosis not present

## 2017-10-16 DIAGNOSIS — M810 Age-related osteoporosis without current pathological fracture: Secondary | ICD-10-CM | POA: Diagnosis not present

## 2017-10-16 DIAGNOSIS — E7800 Pure hypercholesterolemia, unspecified: Secondary | ICD-10-CM | POA: Diagnosis not present

## 2017-10-16 DIAGNOSIS — I10 Essential (primary) hypertension: Secondary | ICD-10-CM | POA: Diagnosis not present

## 2017-10-16 DIAGNOSIS — Z1389 Encounter for screening for other disorder: Secondary | ICD-10-CM | POA: Diagnosis not present

## 2017-10-18 DIAGNOSIS — H2512 Age-related nuclear cataract, left eye: Secondary | ICD-10-CM | POA: Diagnosis not present

## 2017-10-18 DIAGNOSIS — H25812 Combined forms of age-related cataract, left eye: Secondary | ICD-10-CM | POA: Diagnosis not present

## 2017-10-31 DIAGNOSIS — S42302A Unspecified fracture of shaft of humerus, left arm, initial encounter for closed fracture: Secondary | ICD-10-CM | POA: Diagnosis not present

## 2017-10-31 DIAGNOSIS — S42212A Unspecified displaced fracture of surgical neck of left humerus, initial encounter for closed fracture: Secondary | ICD-10-CM | POA: Diagnosis not present

## 2017-10-31 DIAGNOSIS — S42222A 2-part displaced fracture of surgical neck of left humerus, initial encounter for closed fracture: Secondary | ICD-10-CM | POA: Diagnosis not present

## 2017-10-31 DIAGNOSIS — Z961 Presence of intraocular lens: Secondary | ICD-10-CM | POA: Diagnosis not present

## 2017-11-01 DIAGNOSIS — S42212A Unspecified displaced fracture of surgical neck of left humerus, initial encounter for closed fracture: Secondary | ICD-10-CM | POA: Diagnosis not present

## 2017-11-05 DIAGNOSIS — S5292XA Unspecified fracture of left forearm, initial encounter for closed fracture: Secondary | ICD-10-CM | POA: Diagnosis not present

## 2017-11-08 DIAGNOSIS — S42212D Unspecified displaced fracture of surgical neck of left humerus, subsequent encounter for fracture with routine healing: Secondary | ICD-10-CM | POA: Diagnosis not present

## 2017-11-14 DIAGNOSIS — H524 Presbyopia: Secondary | ICD-10-CM | POA: Diagnosis not present

## 2017-11-15 DIAGNOSIS — S42212D Unspecified displaced fracture of surgical neck of left humerus, subsequent encounter for fracture with routine healing: Secondary | ICD-10-CM | POA: Diagnosis not present

## 2017-11-15 DIAGNOSIS — M810 Age-related osteoporosis without current pathological fracture: Secondary | ICD-10-CM | POA: Diagnosis not present

## 2017-12-01 DIAGNOSIS — S42212D Unspecified displaced fracture of surgical neck of left humerus, subsequent encounter for fracture with routine healing: Secondary | ICD-10-CM | POA: Diagnosis not present

## 2017-12-15 DIAGNOSIS — S42212D Unspecified displaced fracture of surgical neck of left humerus, subsequent encounter for fracture with routine healing: Secondary | ICD-10-CM | POA: Diagnosis not present

## 2018-01-17 DIAGNOSIS — E7800 Pure hypercholesterolemia, unspecified: Secondary | ICD-10-CM | POA: Diagnosis not present

## 2018-01-17 DIAGNOSIS — S42212D Unspecified displaced fracture of surgical neck of left humerus, subsequent encounter for fracture with routine healing: Secondary | ICD-10-CM | POA: Diagnosis not present

## 2018-01-17 DIAGNOSIS — E538 Deficiency of other specified B group vitamins: Secondary | ICD-10-CM | POA: Diagnosis not present

## 2018-01-23 DIAGNOSIS — M25512 Pain in left shoulder: Secondary | ICD-10-CM | POA: Diagnosis not present

## 2018-01-23 DIAGNOSIS — M6281 Muscle weakness (generalized): Secondary | ICD-10-CM | POA: Diagnosis not present

## 2018-01-25 DIAGNOSIS — M25512 Pain in left shoulder: Secondary | ICD-10-CM | POA: Diagnosis not present

## 2018-01-25 DIAGNOSIS — M6281 Muscle weakness (generalized): Secondary | ICD-10-CM | POA: Diagnosis not present

## 2018-01-30 DIAGNOSIS — M25512 Pain in left shoulder: Secondary | ICD-10-CM | POA: Diagnosis not present

## 2018-01-30 DIAGNOSIS — M6281 Muscle weakness (generalized): Secondary | ICD-10-CM | POA: Diagnosis not present

## 2018-02-01 DIAGNOSIS — M6281 Muscle weakness (generalized): Secondary | ICD-10-CM | POA: Diagnosis not present

## 2018-02-01 DIAGNOSIS — M25512 Pain in left shoulder: Secondary | ICD-10-CM | POA: Diagnosis not present

## 2018-02-06 DIAGNOSIS — M6281 Muscle weakness (generalized): Secondary | ICD-10-CM | POA: Diagnosis not present

## 2018-02-06 DIAGNOSIS — M25512 Pain in left shoulder: Secondary | ICD-10-CM | POA: Diagnosis not present

## 2018-02-12 DIAGNOSIS — S42212D Unspecified displaced fracture of surgical neck of left humerus, subsequent encounter for fracture with routine healing: Secondary | ICD-10-CM | POA: Diagnosis not present

## 2018-02-13 DIAGNOSIS — M25512 Pain in left shoulder: Secondary | ICD-10-CM | POA: Diagnosis not present

## 2018-02-13 DIAGNOSIS — M6281 Muscle weakness (generalized): Secondary | ICD-10-CM | POA: Diagnosis not present

## 2018-02-15 DIAGNOSIS — M6281 Muscle weakness (generalized): Secondary | ICD-10-CM | POA: Diagnosis not present

## 2018-02-15 DIAGNOSIS — M25512 Pain in left shoulder: Secondary | ICD-10-CM | POA: Diagnosis not present

## 2018-02-20 DIAGNOSIS — M6281 Muscle weakness (generalized): Secondary | ICD-10-CM | POA: Diagnosis not present

## 2018-02-20 DIAGNOSIS — M25512 Pain in left shoulder: Secondary | ICD-10-CM | POA: Diagnosis not present

## 2018-02-22 DIAGNOSIS — M6281 Muscle weakness (generalized): Secondary | ICD-10-CM | POA: Diagnosis not present

## 2018-02-22 DIAGNOSIS — M25512 Pain in left shoulder: Secondary | ICD-10-CM | POA: Diagnosis not present

## 2018-02-27 DIAGNOSIS — M6281 Muscle weakness (generalized): Secondary | ICD-10-CM | POA: Diagnosis not present

## 2018-02-27 DIAGNOSIS — M25512 Pain in left shoulder: Secondary | ICD-10-CM | POA: Diagnosis not present

## 2018-03-06 DIAGNOSIS — M25512 Pain in left shoulder: Secondary | ICD-10-CM | POA: Diagnosis not present

## 2018-03-06 DIAGNOSIS — M6281 Muscle weakness (generalized): Secondary | ICD-10-CM | POA: Diagnosis not present

## 2018-03-14 DIAGNOSIS — M25512 Pain in left shoulder: Secondary | ICD-10-CM | POA: Diagnosis not present

## 2018-03-14 DIAGNOSIS — M6281 Muscle weakness (generalized): Secondary | ICD-10-CM | POA: Diagnosis not present

## 2018-03-14 DIAGNOSIS — S42212D Unspecified displaced fracture of surgical neck of left humerus, subsequent encounter for fracture with routine healing: Secondary | ICD-10-CM | POA: Diagnosis not present

## 2018-06-20 DIAGNOSIS — J329 Chronic sinusitis, unspecified: Secondary | ICD-10-CM | POA: Diagnosis not present

## 2018-12-07 ENCOUNTER — Other Ambulatory Visit: Payer: Self-pay

## 2019-01-03 DIAGNOSIS — E538 Deficiency of other specified B group vitamins: Secondary | ICD-10-CM | POA: Diagnosis not present

## 2019-01-03 DIAGNOSIS — Z0000 Encounter for general adult medical examination without abnormal findings: Secondary | ICD-10-CM | POA: Diagnosis not present

## 2019-01-03 DIAGNOSIS — D519 Vitamin B12 deficiency anemia, unspecified: Secondary | ICD-10-CM | POA: Diagnosis not present

## 2019-01-03 DIAGNOSIS — E7800 Pure hypercholesterolemia, unspecified: Secondary | ICD-10-CM | POA: Diagnosis not present

## 2019-01-03 DIAGNOSIS — Z1389 Encounter for screening for other disorder: Secondary | ICD-10-CM | POA: Diagnosis not present

## 2019-01-03 DIAGNOSIS — R0989 Other specified symptoms and signs involving the circulatory and respiratory systems: Secondary | ICD-10-CM | POA: Diagnosis not present

## 2019-01-03 DIAGNOSIS — I10 Essential (primary) hypertension: Secondary | ICD-10-CM | POA: Diagnosis not present

## 2019-01-03 DIAGNOSIS — M810 Age-related osteoporosis without current pathological fracture: Secondary | ICD-10-CM | POA: Diagnosis not present

## 2019-01-03 DIAGNOSIS — G43909 Migraine, unspecified, not intractable, without status migrainosus: Secondary | ICD-10-CM | POA: Diagnosis not present

## 2019-01-11 DIAGNOSIS — I82C11 Acute embolism and thrombosis of right internal jugular vein: Secondary | ICD-10-CM | POA: Diagnosis not present

## 2019-01-11 DIAGNOSIS — R7309 Other abnormal glucose: Secondary | ICD-10-CM | POA: Diagnosis not present

## 2019-01-11 DIAGNOSIS — R0989 Other specified symptoms and signs involving the circulatory and respiratory systems: Secondary | ICD-10-CM | POA: Diagnosis not present

## 2019-05-15 DIAGNOSIS — R070 Pain in throat: Secondary | ICD-10-CM | POA: Diagnosis not present

## 2019-05-15 DIAGNOSIS — R519 Headache, unspecified: Secondary | ICD-10-CM | POA: Diagnosis not present

## 2019-05-15 DIAGNOSIS — Z20828 Contact with and (suspected) exposure to other viral communicable diseases: Secondary | ICD-10-CM | POA: Diagnosis not present

## 2019-05-15 DIAGNOSIS — R071 Chest pain on breathing: Secondary | ICD-10-CM | POA: Diagnosis not present

## 2019-05-29 DIAGNOSIS — M810 Age-related osteoporosis without current pathological fracture: Secondary | ICD-10-CM | POA: Diagnosis not present

## 2019-05-29 DIAGNOSIS — Z1231 Encounter for screening mammogram for malignant neoplasm of breast: Secondary | ICD-10-CM | POA: Diagnosis not present

## 2019-06-12 DIAGNOSIS — E7800 Pure hypercholesterolemia, unspecified: Secondary | ICD-10-CM | POA: Diagnosis not present

## 2019-06-12 DIAGNOSIS — E538 Deficiency of other specified B group vitamins: Secondary | ICD-10-CM | POA: Diagnosis not present

## 2019-06-12 DIAGNOSIS — R739 Hyperglycemia, unspecified: Secondary | ICD-10-CM | POA: Diagnosis not present

## 2019-06-12 DIAGNOSIS — I10 Essential (primary) hypertension: Secondary | ICD-10-CM | POA: Diagnosis not present

## 2019-07-22 DIAGNOSIS — Z20828 Contact with and (suspected) exposure to other viral communicable diseases: Secondary | ICD-10-CM | POA: Diagnosis not present

## 2019-07-22 DIAGNOSIS — K591 Functional diarrhea: Secondary | ICD-10-CM | POA: Diagnosis not present

## 2019-07-22 DIAGNOSIS — R509 Fever, unspecified: Secondary | ICD-10-CM | POA: Diagnosis not present

## 2019-09-10 DIAGNOSIS — J329 Chronic sinusitis, unspecified: Secondary | ICD-10-CM | POA: Diagnosis not present

## 2019-09-18 DIAGNOSIS — E860 Dehydration: Secondary | ICD-10-CM | POA: Diagnosis not present

## 2019-09-18 DIAGNOSIS — Z20828 Contact with and (suspected) exposure to other viral communicable diseases: Secondary | ICD-10-CM | POA: Diagnosis not present

## 2019-09-18 DIAGNOSIS — R1084 Generalized abdominal pain: Secondary | ICD-10-CM | POA: Diagnosis not present

## 2019-09-18 DIAGNOSIS — J984 Other disorders of lung: Secondary | ICD-10-CM | POA: Diagnosis not present

## 2019-09-18 DIAGNOSIS — R109 Unspecified abdominal pain: Secondary | ICD-10-CM | POA: Diagnosis not present

## 2019-09-18 DIAGNOSIS — R509 Fever, unspecified: Secondary | ICD-10-CM | POA: Diagnosis not present

## 2019-09-18 DIAGNOSIS — A0472 Enterocolitis due to Clostridium difficile, not specified as recurrent: Secondary | ICD-10-CM | POA: Diagnosis not present

## 2019-09-18 DIAGNOSIS — R531 Weakness: Secondary | ICD-10-CM | POA: Diagnosis not present

## 2019-10-04 DIAGNOSIS — E538 Deficiency of other specified B group vitamins: Secondary | ICD-10-CM | POA: Diagnosis not present

## 2019-10-04 DIAGNOSIS — A0472 Enterocolitis due to Clostridium difficile, not specified as recurrent: Secondary | ICD-10-CM | POA: Diagnosis not present

## 2019-10-04 DIAGNOSIS — R197 Diarrhea, unspecified: Secondary | ICD-10-CM | POA: Diagnosis not present

## 2019-12-03 ENCOUNTER — Other Ambulatory Visit: Payer: Self-pay

## 2020-01-15 ENCOUNTER — Ambulatory Visit: Payer: PPO | Admitting: Sports Medicine

## 2020-01-15 ENCOUNTER — Encounter: Payer: Self-pay | Admitting: Sports Medicine

## 2020-01-15 ENCOUNTER — Ambulatory Visit (INDEPENDENT_AMBULATORY_CARE_PROVIDER_SITE_OTHER): Payer: PPO

## 2020-01-15 ENCOUNTER — Other Ambulatory Visit: Payer: Self-pay | Admitting: Sports Medicine

## 2020-01-15 ENCOUNTER — Other Ambulatory Visit: Payer: Self-pay

## 2020-01-15 DIAGNOSIS — M722 Plantar fascial fibromatosis: Secondary | ICD-10-CM | POA: Diagnosis not present

## 2020-01-15 DIAGNOSIS — M79672 Pain in left foot: Secondary | ICD-10-CM

## 2020-01-15 MED ORDER — TRIAMCINOLONE ACETONIDE 10 MG/ML IJ SUSP
10.0000 mg | Freq: Once | INTRAMUSCULAR | Status: AC
  Administered 2020-01-15: 10 mg

## 2020-01-15 NOTE — Patient Instructions (Signed)

## 2020-01-15 NOTE — Progress Notes (Signed)
Subjective: Tonya Baldwin is a 79 y.o. female patient presents to office with complaint of moderate heel pain on the left for the last 2 months reports that she was busy moving and started to have pain pain radiates 1 out of 10 or sometimes it can go up as high as 9 out of 10 worse in the morning or when she goes from sitting to standing reports that she walks a mile a day and is better when she starts to move around but still painful reports that she has been taking Advil and elevating with no complete relief. Denies any other pedal complaints.   Review of systems noncontributory  Patient Active Problem List   Diagnosis Date Noted  . Cystocele 09/14/2012  . Rectocele 09/14/2012    Current Outpatient Medications on File Prior to Visit  Medication Sig Dispense Refill  . lisinopril (ZESTRIL) 2.5 MG tablet Take 2.5 mg by mouth daily.     No current facility-administered medications on file prior to visit.    Allergies  Allergen Reactions  . Plum Pulp   . Sulfa Antibiotics     Other reaction(s): RASH    Objective: Physical Exam General: The patient is alert and oriented x3 in no acute distress.  Dermatology: Skin is warm, dry and supple bilateral lower extremities. Nails 1-10 are normal. There is no erythema, edema, no eccymosis, no open lesions present. Integument is otherwise unremarkable.  Vascular: Dorsalis Pedis pulse and Posterior Tibial pulse are 1/4 bilateral. Capillary fill time is immediate to all digits.  Neurological: Grossly intact to light touch bilateral.  Musculoskeletal: Tenderness to palpation at the medial calcaneal tubercale and through the insertion of the plantar fascia on the left foot. No pain with compression of calcaneus bilateral.  There is decreased Ankle joint range of motion bilateral. All other joints range of motion within normal limits bilateral. Strength 5/5 in all groups bilateral.   Gait: Unassisted, Antalgic avoid weight on left/right heel  Xray,  left foot:  Normal osseous mineralization. Joint spaces preserved. No fracture/dislocation/boney destruction. Calcaneal spur present with mild thickening of plantar fascia. No other soft tissue abnormalities or radiopaque foreign bodies.   Assessment and Plan: Problem List Items Addressed This Visit    None    Visit Diagnoses    Plantar fasciitis of left foot    -  Primary   Pain of left heel          -Complete examination performed.  -Xrays reviewed -Discussed with patient in detail the condition of plantar fasciitis, how this occurs and general treatment options. Explained both conservative and surgical treatments.  -After oral consent and aseptic prep, injected a mixture containing 1 ml of 2%  plain lidocaine, 1 ml 0.5% plain marcaine, 0.5 ml of kenalog 10 and 0.5 ml of dexamethasone phosphate into left heel heel. Post-injection care discussed with patient.  -Recommended good supportive shoes for foot type daily - Explained in detail the use of the fascial brace for the left which was dispensed at today's visit. -Explained and dispensed to patient daily stretching exercises. -Recommend patient to ice affected area 1-2x daily. -Patient to return to office in 3-4 weeks for follow up or sooner if problems or questions arise.  Landis Martins, DPM

## 2020-01-24 ENCOUNTER — Other Ambulatory Visit: Payer: Self-pay | Admitting: Sports Medicine

## 2020-01-24 DIAGNOSIS — M722 Plantar fascial fibromatosis: Secondary | ICD-10-CM

## 2020-02-03 DIAGNOSIS — R519 Headache, unspecified: Secondary | ICD-10-CM | POA: Diagnosis not present

## 2020-02-03 DIAGNOSIS — R509 Fever, unspecified: Secondary | ICD-10-CM | POA: Diagnosis not present

## 2020-02-03 DIAGNOSIS — Z20822 Contact with and (suspected) exposure to covid-19: Secondary | ICD-10-CM | POA: Diagnosis not present

## 2020-02-03 DIAGNOSIS — R05 Cough: Secondary | ICD-10-CM | POA: Diagnosis not present

## 2020-02-03 DIAGNOSIS — R110 Nausea: Secondary | ICD-10-CM | POA: Diagnosis not present

## 2020-02-07 DIAGNOSIS — S92355A Nondisplaced fracture of fifth metatarsal bone, left foot, initial encounter for closed fracture: Secondary | ICD-10-CM | POA: Diagnosis not present

## 2020-02-10 DIAGNOSIS — L237 Allergic contact dermatitis due to plants, except food: Secondary | ICD-10-CM | POA: Diagnosis not present

## 2020-02-12 ENCOUNTER — Encounter: Payer: Self-pay | Admitting: Sports Medicine

## 2020-02-12 ENCOUNTER — Ambulatory Visit: Payer: PPO | Admitting: Sports Medicine

## 2020-02-12 ENCOUNTER — Other Ambulatory Visit: Payer: Self-pay

## 2020-02-12 ENCOUNTER — Ambulatory Visit (INDEPENDENT_AMBULATORY_CARE_PROVIDER_SITE_OTHER): Payer: PPO | Admitting: Sports Medicine

## 2020-02-12 DIAGNOSIS — M722 Plantar fascial fibromatosis: Secondary | ICD-10-CM | POA: Diagnosis not present

## 2020-02-12 DIAGNOSIS — M79672 Pain in left foot: Secondary | ICD-10-CM | POA: Diagnosis not present

## 2020-02-12 DIAGNOSIS — S92352A Displaced fracture of fifth metatarsal bone, left foot, initial encounter for closed fracture: Secondary | ICD-10-CM | POA: Diagnosis not present

## 2020-02-12 NOTE — Progress Notes (Signed)
Subjective: Tonya Baldwin is a 79 y.o. female returns to office for follow up evaluation after Left heel injection for plantar fasciitis, injection #1 administered 4 weeks ago. Patient states that the injection seems to help her pain but did have an injury to her foot where she broke her fifth metatarsal and went to the ER on Saturday and they put her in a walking boot reports that she has no pain on the bottom of the heel the pain now is on the side of the foot at the area where she has a fracture.  Patient reports that she is struggling with poison ivy at this time and is currently on steroids by mouth as well as a topical cream.  Patient denies any other pedal complaints at this time.  Patient Active Problem List   Diagnosis Date Noted  . Cystocele 09/14/2012  . Rectocele 09/14/2012    Current Outpatient Medications on File Prior to Visit  Medication Sig Dispense Refill  . lisinopril (ZESTRIL) 2.5 MG tablet Take 2.5 mg by mouth daily.     No current facility-administered medications on file prior to visit.    Allergies  Allergen Reactions  . Plum Pulp   . Sulfa Antibiotics     Other reaction(s): RASH    Objective:   General:  Alert and oriented x 3, in no acute distress  Dermatology: Skin is warm, dry and supple bilateral lower extremities. Nails 1-10 are normal.  There is ecchymosis noted to the left foot with focal edema.  There is no open lesions present. Integument is otherwise unremarkable.   Vascular: Dorsalis Pedis pulse and Posterior Tibial pulse are 1/4 bilateral. Capillary fill time is immediate to all digits.   Neurological: Grossly intact to light touch bilateral.   Musculoskeletal: Minimal tenderness to palpation at the medial calcaneal tubercale and through the insertion of the plantar fascia on the left foot.  Most tenderness noted today at the fifth metatarsal ray left foot.  Decreased range of motion on the left pain.  Strength 5/5 in all groups bilateral except on  the left today due to guarding due to pain.    Assessment and Plan: Problem List Items Addressed This Visit    None    Visit Diagnoses    Plantar fasciitis of left foot    -  Primary   Pain of left heel       Closed fracture of base of fifth metatarsal bone of left foot       Left foot pain          -Complete examination performed.  -Previous x-rays reviewed. -Discussed with patient in detail the condition of plantar fasciitis now new fifth metatarsal fracture -Advised patient to continue with cam boot -Continue with rest ice elevation -Advised patient to limit activity to assist with healing of fracture -Advised patient to use compression sleeve as dispensed this visit to help with edema control -Patient to return to office in 3 weeks for x-ray to evaluate healing of fracture and for follow-up on heel pain or sooner if problems or questions arise.  Landis Martins, DPM

## 2020-03-06 ENCOUNTER — Ambulatory Visit (INDEPENDENT_AMBULATORY_CARE_PROVIDER_SITE_OTHER): Payer: PPO

## 2020-03-06 ENCOUNTER — Encounter: Payer: Self-pay | Admitting: Sports Medicine

## 2020-03-06 ENCOUNTER — Other Ambulatory Visit: Payer: Self-pay

## 2020-03-06 ENCOUNTER — Ambulatory Visit (INDEPENDENT_AMBULATORY_CARE_PROVIDER_SITE_OTHER): Payer: PPO | Admitting: Sports Medicine

## 2020-03-06 DIAGNOSIS — M722 Plantar fascial fibromatosis: Secondary | ICD-10-CM | POA: Diagnosis not present

## 2020-03-06 DIAGNOSIS — S92352A Displaced fracture of fifth metatarsal bone, left foot, initial encounter for closed fracture: Secondary | ICD-10-CM

## 2020-03-06 DIAGNOSIS — M79672 Pain in left foot: Secondary | ICD-10-CM

## 2020-03-06 NOTE — Progress Notes (Signed)
Subjective: Tonya Baldwin is a 79 y.o. female returns to office for follow up evaluation left foot fracture.  Patient reports that she also still has a little bit of heel pain doing a little better using cam boot for fracture.  Patient denies any other symptoms at this time.  Patient Active Problem List   Diagnosis Date Noted  . Cystocele 09/14/2012  . Rectocele 09/14/2012    Current Outpatient Medications on File Prior to Visit  Medication Sig Dispense Refill  . lisinopril (ZESTRIL) 2.5 MG tablet Take 2.5 mg by mouth daily.     No current facility-administered medications on file prior to visit.    Allergies  Allergen Reactions  . Plum Pulp   . Sulfa Antibiotics     Other reaction(s): RASH    Objective:   General:  Alert and oriented x 3, in no acute distress  Dermatology: Skin is warm, dry and supple bilateral lower extremities. Nails 1-10 are normal.  There is resolved ecchymosis noted to the left foot with resolved edema.  There is no open lesions present. Integument is otherwise unremarkable.   Vascular: Dorsalis Pedis pulse and Posterior Tibial pulse are 1/4 bilateral. Capillary fill time is immediate to all digits.   Neurological: Grossly intact to light touch bilateral.   Musculoskeletal: Minimal tenderness to palpation at the medial calcaneal tubercale and through the insertion of the plantar fascia on the left foot.  Decreased tenderness of the fifth metatarsal base on the left.  Decreased range of motion on the left pain.  Strength 5/5 in all groups bilateral except on the left today due to guarding.  X-rays consistent with fifth metatarsal base fracture appears to be about 30% healed   Assessment and Plan: Problem List Items Addressed This Visit    None    Visit Diagnoses    Closed fracture of base of fifth metatarsal bone of left foot    -  Primary   Relevant Orders   DG Foot Complete Left   Pain of left heel       Plantar fasciitis of left foot       Left  foot pain          -Complete examination performed.  -X-rays reviewed -Re-Discussed continued care of plantar fasciitis and fifth metatarsal fracture on left  -Advised patient to continue with cam boot a replacement boot given this visit-Continue with rest ice elevation -Advised patient to limit activity to assist with healing of fracture -Patient to return to office in 4 weeks for x-ray or sooner if problems or questions arise.  Landis Martins, DPM

## 2020-04-08 ENCOUNTER — Other Ambulatory Visit: Payer: Self-pay

## 2020-04-08 ENCOUNTER — Ambulatory Visit (INDEPENDENT_AMBULATORY_CARE_PROVIDER_SITE_OTHER): Payer: PPO | Admitting: Sports Medicine

## 2020-04-08 ENCOUNTER — Encounter: Payer: Self-pay | Admitting: Sports Medicine

## 2020-04-08 DIAGNOSIS — S92352A Displaced fracture of fifth metatarsal bone, left foot, initial encounter for closed fracture: Secondary | ICD-10-CM

## 2020-04-08 DIAGNOSIS — M79672 Pain in left foot: Secondary | ICD-10-CM

## 2020-04-08 DIAGNOSIS — M722 Plantar fascial fibromatosis: Secondary | ICD-10-CM | POA: Diagnosis not present

## 2020-04-08 MED ORDER — TRIAMCINOLONE ACETONIDE 10 MG/ML IJ SUSP
10.0000 mg | Freq: Once | INTRAMUSCULAR | Status: AC
  Administered 2020-04-08: 10 mg

## 2020-04-08 NOTE — Progress Notes (Signed)
Subjective: Tonya Baldwin is a 79 y.o. female returns to office for follow up evaluation left foot fracture. Patient reports that her foot fracture really never bothers her but now has increased pain to the heel.  Patient reports that she has been using cam boot but was chasing after her dog and took a misstep and felt immediately an increase in pain in her heel.  Patient denies a pop or significant swelling or bruising to the area.  Patient denies any other symptoms at this time.  Patient Active Problem List   Diagnosis Date Noted  . Cystocele 09/14/2012  . Rectocele 09/14/2012    Current Outpatient Medications on File Prior to Visit  Medication Sig Dispense Refill  . lisinopril (ZESTRIL) 2.5 MG tablet Take 2.5 mg by mouth daily.     No current facility-administered medications on file prior to visit.    Allergies  Allergen Reactions  . Plum Pulp   . Sulfa Antibiotics     Other reaction(s): RASH    Objective:   General:  Alert and oriented x 3, in no acute distress  Dermatology: Skin is warm, dry and supple bilateral lower extremities. Nails 1-10 are normal.  No ecchymosis or edema noted to the left foot.  There is no open lesions present. Integument is otherwise unremarkable.   Vascular: Dorsalis Pedis pulse and Posterior Tibial pulse are 1/4 bilateral. Capillary fill time is immediate to all digits.   Neurological: Grossly intact to light touch bilateral.   Musculoskeletal: Increased tenderness to palpation at the medial calcaneal tubercale and through the insertion of the plantar fascia on the left foot.  Minimal tenderness of the fifth metatarsal base on the left.  Decreased range of motion on the left pain.  Strength within normal limits for patient condition.   Assessment and Plan: Problem List Items Addressed This Visit    None    Visit Diagnoses    Plantar fasciitis of left foot    -  Primary   Pain of left heel       Closed fracture of base of fifth metatarsal bone  of left foot       Left foot pain          -Complete examination performed.  -Re-Discussed continued care of plantar fasciitis and fifth metatarsal fracture on left  -Patient declined repeat x-ray today's visit and wants to wait until next visit for an x-ray -After oral consent and aseptic prep, injected a mixture containing 1 ml of 2%  plain lidocaine, 1 ml 0.5% plain marcaine, 0.5 ml of kenalog 10 and 0.5 ml of dexamethasone phosphate into left heel without complication to patient's tolerance. Post-injection care discussed with patient.  -Recommend rest ice elevation and gentle stretching as tolerated -Advised patient to continue with cam boot for heel pain and for fifth metatarsal fracture -Advised patient to continue with limited activity to assist with healing and continue with calcium and vitamin D supplementation -Patient to return to office in 4 to 6 weeks for x-ray left foot fracture and follow-up on heel pain or sooner if problems or questions arise.  Landis Martins, DPM

## 2020-05-11 DIAGNOSIS — J302 Other seasonal allergic rhinitis: Secondary | ICD-10-CM | POA: Diagnosis not present

## 2020-05-11 DIAGNOSIS — K589 Irritable bowel syndrome without diarrhea: Secondary | ICD-10-CM | POA: Diagnosis not present

## 2020-05-11 DIAGNOSIS — G43109 Migraine with aura, not intractable, without status migrainosus: Secondary | ICD-10-CM | POA: Diagnosis not present

## 2020-05-11 DIAGNOSIS — Z6829 Body mass index (BMI) 29.0-29.9, adult: Secondary | ICD-10-CM | POA: Diagnosis not present

## 2020-05-11 DIAGNOSIS — M810 Age-related osteoporosis without current pathological fracture: Secondary | ICD-10-CM | POA: Diagnosis not present

## 2020-05-11 DIAGNOSIS — E7800 Pure hypercholesterolemia, unspecified: Secondary | ICD-10-CM | POA: Diagnosis not present

## 2020-05-11 DIAGNOSIS — R7303 Prediabetes: Secondary | ICD-10-CM | POA: Diagnosis not present

## 2020-05-11 DIAGNOSIS — I10 Essential (primary) hypertension: Secondary | ICD-10-CM | POA: Diagnosis not present

## 2020-05-11 DIAGNOSIS — Z872 Personal history of diseases of the skin and subcutaneous tissue: Secondary | ICD-10-CM | POA: Diagnosis not present

## 2020-05-11 DIAGNOSIS — Z1389 Encounter for screening for other disorder: Secondary | ICD-10-CM | POA: Diagnosis not present

## 2020-05-11 DIAGNOSIS — D510 Vitamin B12 deficiency anemia due to intrinsic factor deficiency: Secondary | ICD-10-CM | POA: Diagnosis not present

## 2020-05-11 DIAGNOSIS — Z0000 Encounter for general adult medical examination without abnormal findings: Secondary | ICD-10-CM | POA: Diagnosis not present

## 2020-05-15 DIAGNOSIS — R011 Cardiac murmur, unspecified: Secondary | ICD-10-CM | POA: Diagnosis not present

## 2020-05-15 DIAGNOSIS — I340 Nonrheumatic mitral (valve) insufficiency: Secondary | ICD-10-CM | POA: Diagnosis not present

## 2020-05-15 DIAGNOSIS — I361 Nonrheumatic tricuspid (valve) insufficiency: Secondary | ICD-10-CM | POA: Diagnosis not present

## 2020-05-20 ENCOUNTER — Other Ambulatory Visit: Payer: Self-pay

## 2020-05-20 ENCOUNTER — Ambulatory Visit: Payer: PPO | Admitting: Sports Medicine

## 2020-05-20 DIAGNOSIS — Z20828 Contact with and (suspected) exposure to other viral communicable diseases: Secondary | ICD-10-CM | POA: Diagnosis not present

## 2020-05-20 DIAGNOSIS — J029 Acute pharyngitis, unspecified: Secondary | ICD-10-CM | POA: Diagnosis not present

## 2020-05-28 DIAGNOSIS — Z20828 Contact with and (suspected) exposure to other viral communicable diseases: Secondary | ICD-10-CM | POA: Diagnosis not present

## 2020-06-02 DIAGNOSIS — U071 COVID-19: Secondary | ICD-10-CM | POA: Diagnosis not present

## 2020-06-03 ENCOUNTER — Ambulatory Visit: Payer: PPO | Admitting: Sports Medicine

## 2020-06-03 DIAGNOSIS — U071 COVID-19: Secondary | ICD-10-CM | POA: Diagnosis not present

## 2020-06-10 DIAGNOSIS — M810 Age-related osteoporosis without current pathological fracture: Secondary | ICD-10-CM | POA: Diagnosis not present

## 2020-06-16 ENCOUNTER — Ambulatory Visit (INDEPENDENT_AMBULATORY_CARE_PROVIDER_SITE_OTHER): Payer: PPO

## 2020-06-16 ENCOUNTER — Other Ambulatory Visit: Payer: Self-pay

## 2020-06-16 ENCOUNTER — Encounter: Payer: Self-pay | Admitting: Sports Medicine

## 2020-06-16 ENCOUNTER — Ambulatory Visit: Payer: PPO | Admitting: Sports Medicine

## 2020-06-16 DIAGNOSIS — M722 Plantar fascial fibromatosis: Secondary | ICD-10-CM

## 2020-06-16 DIAGNOSIS — S92352A Displaced fracture of fifth metatarsal bone, left foot, initial encounter for closed fracture: Secondary | ICD-10-CM | POA: Diagnosis not present

## 2020-06-16 DIAGNOSIS — M79672 Pain in left foot: Secondary | ICD-10-CM

## 2020-06-16 MED ORDER — TRIAMCINOLONE ACETONIDE 10 MG/ML IJ SUSP
10.0000 mg | Freq: Once | INTRAMUSCULAR | Status: AC
Start: 2020-06-17 — End: 2020-06-16
  Administered 2020-06-16: 10 mg

## 2020-06-16 NOTE — Progress Notes (Signed)
Subjective: Tonya Baldwin is a 80 y.o. female returns to office for follow up evaluation left foot fracture and heel pain. Patient reports her foot is doing okay but did have some heel pain that started on last week again.  Patient reports that he she is still been using the cam boot and is wondering how her fracture is healing.  Patient denies any repeat injury no other acute symptoms at this time.  Patient Active Problem List   Diagnosis Date Noted  . Cystocele 09/14/2012  . Rectocele 09/14/2012    Current Outpatient Medications on File Prior to Visit  Medication Sig Dispense Refill  . lisinopril (ZESTRIL) 2.5 MG tablet Take 2.5 mg by mouth daily.    . pravastatin (PRAVACHOL) 40 MG tablet Take 40 mg by mouth at bedtime.     No current facility-administered medications on file prior to visit.    Allergies  Allergen Reactions  . Plum Pulp   . Sulfa Antibiotics     Other reaction(s): RASH    Objective:   General:  Alert and oriented x 3, in no acute distress  Dermatology: Skin is warm, dry and supple bilateral lower extremities. Nails 1-10 are normal.  No ecchymosis or edema noted to the left foot.  There is no open lesions present. Integument is otherwise unremarkable.   Vascular: Dorsalis Pedis pulse and Posterior Tibial pulse are 1/4 bilateral. Capillary fill time is immediate to all digits.   Neurological: Grossly intact to light touch bilateral.   Musculoskeletal: There is mild tenderness to palpation at the medial calcaneal tubercale and through the insertion of the plantar fascia on the left foot.  Minimal tenderness of the fifth metatarsal base on the left.  Decreased range of motion on the left pain.  Strength within normal limits for patient condition.   Assessment and Plan: Problem List Items Addressed This Visit   None   Visit Diagnoses    Closed fracture of base of fifth metatarsal bone of left foot    -  Primary   Over 90% healed   Relevant Orders   DG Foot  Complete Left   Pain of left heel       Plantar fasciitis of left foot       Relevant Medications   triamcinolone acetonide (KENALOG) 10 MG/ML injection 10 mg (Completed) (Start on 06/17/2020 12:00 AM)   Left foot pain          -Complete examination performed.  -X-rays reviewed heel wound almost completely healed fifth metatarsal fracture -Advised patient that she may now slowly transition out of cam boot to tennis shoe with heel lifts as provided -After oral consent and aseptic prep, injected a mixture containing 1 ml of 2%  plain lidocaine, 1 ml 0.5% plain marcaine, 0.5 ml of kenalog 10 and 0.5 ml of dexamethasone phosphate into left heel without complication to patient's tolerance. Post-injection care discussed with patient.  This is injection #2 to the area. -Recommend rest ice elevation and gentle stretching as tolerated -Patient to return to office if symptoms fail to continue to improve or heel pain worsens again or sooner if problems or questions arise.  Landis Martins, DPM

## 2020-07-08 DIAGNOSIS — Z1231 Encounter for screening mammogram for malignant neoplasm of breast: Secondary | ICD-10-CM | POA: Diagnosis not present

## 2020-07-08 DIAGNOSIS — M810 Age-related osteoporosis without current pathological fracture: Secondary | ICD-10-CM | POA: Diagnosis not present

## 2020-08-10 DIAGNOSIS — E7800 Pure hypercholesterolemia, unspecified: Secondary | ICD-10-CM | POA: Diagnosis not present

## 2020-08-10 DIAGNOSIS — I10 Essential (primary) hypertension: Secondary | ICD-10-CM | POA: Diagnosis not present

## 2020-08-10 DIAGNOSIS — R7303 Prediabetes: Secondary | ICD-10-CM | POA: Diagnosis not present

## 2020-10-12 DIAGNOSIS — K5732 Diverticulitis of large intestine without perforation or abscess without bleeding: Secondary | ICD-10-CM | POA: Diagnosis not present

## 2020-11-12 DIAGNOSIS — R059 Cough, unspecified: Secondary | ICD-10-CM | POA: Diagnosis not present

## 2020-11-12 DIAGNOSIS — R7303 Prediabetes: Secondary | ICD-10-CM | POA: Diagnosis not present

## 2020-11-12 DIAGNOSIS — E7800 Pure hypercholesterolemia, unspecified: Secondary | ICD-10-CM | POA: Diagnosis not present

## 2020-11-12 DIAGNOSIS — E538 Deficiency of other specified B group vitamins: Secondary | ICD-10-CM | POA: Diagnosis not present

## 2020-11-12 DIAGNOSIS — I10 Essential (primary) hypertension: Secondary | ICD-10-CM | POA: Diagnosis not present

## 2021-01-11 DIAGNOSIS — R079 Chest pain, unspecified: Secondary | ICD-10-CM | POA: Diagnosis not present

## 2021-01-11 DIAGNOSIS — R112 Nausea with vomiting, unspecified: Secondary | ICD-10-CM | POA: Diagnosis not present

## 2021-01-11 DIAGNOSIS — R0789 Other chest pain: Secondary | ICD-10-CM | POA: Diagnosis not present

## 2021-01-11 DIAGNOSIS — R61 Generalized hyperhidrosis: Secondary | ICD-10-CM | POA: Diagnosis not present

## 2021-01-11 DIAGNOSIS — T8069XA Other serum reaction due to other serum, initial encounter: Secondary | ICD-10-CM | POA: Diagnosis not present

## 2021-01-22 DIAGNOSIS — H26491 Other secondary cataract, right eye: Secondary | ICD-10-CM | POA: Diagnosis not present

## 2021-02-05 DIAGNOSIS — H26492 Other secondary cataract, left eye: Secondary | ICD-10-CM | POA: Diagnosis not present

## 2021-02-15 DIAGNOSIS — R7303 Prediabetes: Secondary | ICD-10-CM | POA: Diagnosis not present

## 2021-02-15 DIAGNOSIS — E7800 Pure hypercholesterolemia, unspecified: Secondary | ICD-10-CM | POA: Diagnosis not present

## 2021-06-18 DIAGNOSIS — R7303 Prediabetes: Secondary | ICD-10-CM | POA: Diagnosis not present

## 2021-06-18 DIAGNOSIS — Z1331 Encounter for screening for depression: Secondary | ICD-10-CM | POA: Diagnosis not present

## 2021-06-18 DIAGNOSIS — Z6828 Body mass index (BMI) 28.0-28.9, adult: Secondary | ICD-10-CM | POA: Diagnosis not present

## 2021-06-18 DIAGNOSIS — M810 Age-related osteoporosis without current pathological fracture: Secondary | ICD-10-CM | POA: Diagnosis not present

## 2021-06-18 DIAGNOSIS — Z872 Personal history of diseases of the skin and subcutaneous tissue: Secondary | ICD-10-CM | POA: Diagnosis not present

## 2021-06-18 DIAGNOSIS — D510 Vitamin B12 deficiency anemia due to intrinsic factor deficiency: Secondary | ICD-10-CM | POA: Diagnosis not present

## 2021-06-18 DIAGNOSIS — E7800 Pure hypercholesterolemia, unspecified: Secondary | ICD-10-CM | POA: Diagnosis not present

## 2021-06-18 DIAGNOSIS — G43109 Migraine with aura, not intractable, without status migrainosus: Secondary | ICD-10-CM | POA: Diagnosis not present

## 2021-06-18 DIAGNOSIS — I10 Essential (primary) hypertension: Secondary | ICD-10-CM | POA: Diagnosis not present

## 2021-06-18 DIAGNOSIS — Z0000 Encounter for general adult medical examination without abnormal findings: Secondary | ICD-10-CM | POA: Diagnosis not present

## 2021-06-25 DIAGNOSIS — J0110 Acute frontal sinusitis, unspecified: Secondary | ICD-10-CM | POA: Diagnosis not present

## 2021-07-05 DIAGNOSIS — S92302A Fracture of unspecified metatarsal bone(s), left foot, initial encounter for closed fracture: Secondary | ICD-10-CM | POA: Diagnosis not present

## 2021-07-06 DIAGNOSIS — S92351A Displaced fracture of fifth metatarsal bone, right foot, initial encounter for closed fracture: Secondary | ICD-10-CM | POA: Diagnosis not present

## 2021-07-29 DIAGNOSIS — Z9889 Other specified postprocedural states: Secondary | ICD-10-CM | POA: Diagnosis not present

## 2021-07-29 DIAGNOSIS — S92351A Displaced fracture of fifth metatarsal bone, right foot, initial encounter for closed fracture: Secondary | ICD-10-CM | POA: Diagnosis not present

## 2021-08-26 DIAGNOSIS — Z9889 Other specified postprocedural states: Secondary | ICD-10-CM | POA: Diagnosis not present

## 2021-08-26 DIAGNOSIS — S92351A Displaced fracture of fifth metatarsal bone, right foot, initial encounter for closed fracture: Secondary | ICD-10-CM | POA: Diagnosis not present

## 2021-09-21 DIAGNOSIS — E7800 Pure hypercholesterolemia, unspecified: Secondary | ICD-10-CM | POA: Diagnosis not present

## 2021-09-21 DIAGNOSIS — I10 Essential (primary) hypertension: Secondary | ICD-10-CM | POA: Diagnosis not present

## 2021-09-21 DIAGNOSIS — R7303 Prediabetes: Secondary | ICD-10-CM | POA: Diagnosis not present

## 2021-10-21 DIAGNOSIS — E663 Overweight: Secondary | ICD-10-CM | POA: Diagnosis not present

## 2021-10-21 DIAGNOSIS — I10 Essential (primary) hypertension: Secondary | ICD-10-CM | POA: Diagnosis not present

## 2021-10-21 DIAGNOSIS — E785 Hyperlipidemia, unspecified: Secondary | ICD-10-CM | POA: Diagnosis not present

## 2021-10-21 DIAGNOSIS — G43109 Migraine with aura, not intractable, without status migrainosus: Secondary | ICD-10-CM | POA: Diagnosis not present

## 2021-10-21 DIAGNOSIS — Z9849 Cataract extraction status, unspecified eye: Secondary | ICD-10-CM | POA: Diagnosis not present

## 2021-10-21 DIAGNOSIS — R7303 Prediabetes: Secondary | ICD-10-CM | POA: Diagnosis not present

## 2021-10-21 DIAGNOSIS — M810 Age-related osteoporosis without current pathological fracture: Secondary | ICD-10-CM | POA: Diagnosis not present

## 2021-10-21 DIAGNOSIS — Z7982 Long term (current) use of aspirin: Secondary | ICD-10-CM | POA: Diagnosis not present

## 2021-10-21 DIAGNOSIS — H3530 Unspecified macular degeneration: Secondary | ICD-10-CM | POA: Diagnosis not present

## 2021-12-24 DIAGNOSIS — I10 Essential (primary) hypertension: Secondary | ICD-10-CM | POA: Diagnosis not present

## 2021-12-24 DIAGNOSIS — R7303 Prediabetes: Secondary | ICD-10-CM | POA: Diagnosis not present

## 2021-12-24 DIAGNOSIS — E7800 Pure hypercholesterolemia, unspecified: Secondary | ICD-10-CM | POA: Diagnosis not present

## 2022-03-30 ENCOUNTER — Ambulatory Visit: Payer: PPO | Admitting: Internal Medicine

## 2022-04-18 ENCOUNTER — Encounter: Payer: Self-pay | Admitting: Internal Medicine

## 2022-04-18 ENCOUNTER — Ambulatory Visit (INDEPENDENT_AMBULATORY_CARE_PROVIDER_SITE_OTHER): Payer: PPO | Admitting: Internal Medicine

## 2022-04-18 VITALS — BP 126/84 | HR 67 | Temp 98.7°F | Resp 18 | Ht 67.0 in | Wt 183.2 lb

## 2022-04-18 DIAGNOSIS — E7800 Pure hypercholesterolemia, unspecified: Secondary | ICD-10-CM | POA: Diagnosis not present

## 2022-04-18 DIAGNOSIS — R7303 Prediabetes: Secondary | ICD-10-CM

## 2022-04-18 DIAGNOSIS — I10 Essential (primary) hypertension: Secondary | ICD-10-CM

## 2022-04-18 HISTORY — DX: Essential (primary) hypertension: I10

## 2022-04-18 HISTORY — DX: Prediabetes: R73.03

## 2022-04-18 NOTE — Assessment & Plan Note (Signed)
Her BP is doing well today.  We will continue her current meds.

## 2022-04-18 NOTE — Assessment & Plan Note (Signed)
I am going to check her FLP today since she is doing diet and exercise.

## 2022-04-18 NOTE — Assessment & Plan Note (Signed)
We will also check her HgBA1c today for her prediabetes to see if lifestyle modification is helping.

## 2022-04-18 NOTE — Progress Notes (Signed)
Office Visit  Subjective   Patient ID: Tonya Baldwin   DOB: Sep 09, 1940   Age: 81 y.o.   MRN: [id number]   Chief Complaint Chief Complaint  Patient presents with   Follow-up    Prediabetes, HTN, hypercholesterolemia     History of Present Illness The patient is a 80 year old Caucasian/White female who presents for a follow-up of hypertension.  This past year, she has multiple visits where her BP has been borderline.  She had problems with an elevated BP in 2019 where she was hospitalized.  She was started on lisinopril but her BP was low and she became symptomatic so we stopped her BP meds.  However, she still had some elevated BP where we restarted on low dose lisinopril. He also noted a heart murmur on her this past year.  We did an ECHO and this showed some mild concentric LVH with diastolic dysfunction with an LVEF of 60-65% with mild dilatation of her LA, and mild TR.   Her current medications include:  Lisinopril 2.'5mg'$  po daily.  The patient denies any headache, visual changes, dizziness, lightheadness, chest pain, shortness of breath, weakness/numbness, and edema. She reports there have been no other symptoms noted.  The patient is a 81 year old Caucasian/White female who returns for a follow-up visit for her prediabetes.  O Last year, her HgBA1c was a bit worse and we discussed with her with starting metformin vs. diet/exercise.  During her yearly exam in 12/2018 we noted that her random glucose was slightly elevated. She came in for a HgBa1c which was 6.2% where she was prediabetic. Since the last visit, there have been no problems. She remains on no medications; the diabetes is being managed by dietary intervention alone. She is also walking. She specifically denies unexplained abdominal pain, nausea or vomiting. She does not routinely check blood sugars. She came in fasting today in anticipation of lab work. Her last HgbA1c was done in 09/2021 and was 6.5%.    Linwood Dibbles returns today  for routine followup on her cholesterol.  We have noted that her TC and LDL has remained elevated.  She has been on Lipitor in the past but stopped this due to nausea.  We also tried her on crestor which has now also caused her to have nausea.  She has been on pravastatin in the past but this made her nauseated as well.  Overall, she states she is doing well and is without any complaints or problems at this time. She specifically denies abdominal pain, nausea, vomiting, diarrhea, myalgias, and fatigue. She remains on dietary management.  She is fasting in anticipation for labs today.              Past Medical History Past Medical History:  Diagnosis Date   Anemia    Asthma    Hypercholesterolemia    IBS (irritable bowel syndrome)    Migraine    Osteopenia    Restless leg syndrome    Seasonal allergies      Allergies Allergies  Allergen Reactions   Plum Pulp Other (See Comments)   Sulfa Antibiotics Dermatitis    Other reaction(s): RASH     Review of Systems Review of Systems  Constitutional:  Negative for chills and fever.  Eyes:  Negative for blurred vision and double vision.  Respiratory:  Negative for cough and shortness of breath.   Cardiovascular:  Negative for chest pain, palpitations and leg swelling.  Gastrointestinal:  Negative for constipation, diarrhea,  nausea and vomiting.  Musculoskeletal:  Negative for myalgias.  Skin:  Negative for itching and rash.  Neurological:  Negative for dizziness, weakness and headaches.       Objective:    Vitals BP 126/84 (BP Location: Right Arm, Patient Position: Sitting, Cuff Size: Normal)   Pulse 67   Temp 98.7 F (37.1 C) (Temporal)   Resp 18   Ht '5\' 7"'$  (1.702 m)   Wt 183 lb 3.2 oz (83.1 kg)   SpO2 95%   BMI 28.69 kg/m    Physical Examination Physical Exam Constitutional:      Appearance: Normal appearance. She is not ill-appearing.  Cardiovascular:     Rate and Rhythm: Normal rate and regular rhythm.      Pulses: Normal pulses.     Heart sounds: Normal heart sounds. No murmur heard.    No friction rub. No gallop.  Pulmonary:     Effort: Pulmonary effort is normal. No respiratory distress.     Breath sounds: No wheezing, rhonchi or rales.  Abdominal:     General: Abdomen is flat. Bowel sounds are normal. There is no distension.     Palpations: Abdomen is soft.     Tenderness: There is no abdominal tenderness.  Musculoskeletal:     Right lower leg: No edema.     Left lower leg: No edema.  Skin:    General: Skin is warm and dry.     Findings: No rash.  Neurological:     General: No focal deficit present.     Mental Status: She is alert and oriented to person, place, and time.  Psychiatric:        Mood and Affect: Mood normal.        Behavior: Behavior normal.        Assessment & Plan:   Essential hypertension Her BP is doing well today.  We will continue her current meds.  Hypercholesterolemia I am going to check her FLP today since she is doing diet and exercise.  Prediabetes We will also check her HgBA1c today for her prediabetes to see if lifestyle modification is helping.    Return in about 3 months (around 07/18/2022) for annual.   Townsend Roger, MD

## 2022-04-19 LAB — LIPID PANEL
Chol/HDL Ratio: 3.2 ratio (ref 0.0–4.4)
Cholesterol, Total: 220 mg/dL — ABNORMAL HIGH (ref 100–199)
HDL: 68 mg/dL (ref >39)
LDL Chol Calc (NIH): 136 mg/dL — ABNORMAL HIGH (ref 0–99)
Triglycerides: 89 mg/dL (ref 0–149)
VLDL Cholesterol Cal: 16 mg/dL (ref 5–40)

## 2022-04-19 LAB — HEMOGLOBIN A1C
Est. average glucose Bld gHb Est-mCnc: 131 mg/dL
Hgb A1c MFr Bld: 6.2 % — ABNORMAL HIGH (ref 4.8–5.6)

## 2022-06-06 NOTE — Progress Notes (Signed)
Patient aware.Her prediabetes is controlled. Her cholesterol is not controlled. Watch her diet and exercise.

## 2022-06-08 ENCOUNTER — Other Ambulatory Visit: Payer: Self-pay | Admitting: Internal Medicine

## 2022-06-29 DIAGNOSIS — J90 Pleural effusion, not elsewhere classified: Secondary | ICD-10-CM | POA: Diagnosis not present

## 2022-06-29 DIAGNOSIS — I672 Cerebral atherosclerosis: Secondary | ICD-10-CM | POA: Diagnosis not present

## 2022-06-29 DIAGNOSIS — I10 Essential (primary) hypertension: Secondary | ICD-10-CM | POA: Diagnosis not present

## 2022-06-29 DIAGNOSIS — R079 Chest pain, unspecified: Secondary | ICD-10-CM | POA: Diagnosis not present

## 2022-06-29 DIAGNOSIS — R0782 Intercostal pain: Secondary | ICD-10-CM | POA: Diagnosis not present

## 2022-06-29 DIAGNOSIS — R0789 Other chest pain: Secondary | ICD-10-CM | POA: Diagnosis not present

## 2022-06-29 DIAGNOSIS — Z79899 Other long term (current) drug therapy: Secondary | ICD-10-CM | POA: Diagnosis not present

## 2022-06-29 DIAGNOSIS — S0990XA Unspecified injury of head, initial encounter: Secondary | ICD-10-CM | POA: Diagnosis not present

## 2022-06-30 DIAGNOSIS — S0990XA Unspecified injury of head, initial encounter: Secondary | ICD-10-CM | POA: Diagnosis not present

## 2022-06-30 DIAGNOSIS — R079 Chest pain, unspecified: Secondary | ICD-10-CM | POA: Diagnosis not present

## 2022-06-30 DIAGNOSIS — J90 Pleural effusion, not elsewhere classified: Secondary | ICD-10-CM | POA: Diagnosis not present

## 2022-07-18 ENCOUNTER — Encounter: Payer: PPO | Admitting: Internal Medicine

## 2022-08-28 DIAGNOSIS — Z79899 Other long term (current) drug therapy: Secondary | ICD-10-CM | POA: Diagnosis not present

## 2022-08-28 DIAGNOSIS — I10 Essential (primary) hypertension: Secondary | ICD-10-CM | POA: Diagnosis not present

## 2022-09-01 ENCOUNTER — Other Ambulatory Visit: Payer: Self-pay | Admitting: Internal Medicine

## 2022-10-24 ENCOUNTER — Other Ambulatory Visit: Payer: Self-pay | Admitting: Internal Medicine

## 2022-10-26 ENCOUNTER — Encounter: Payer: PPO | Admitting: Internal Medicine

## 2023-01-03 ENCOUNTER — Encounter: Payer: Self-pay | Admitting: Student

## 2023-01-03 ENCOUNTER — Other Ambulatory Visit: Payer: Self-pay | Admitting: Internal Medicine

## 2023-01-03 ENCOUNTER — Ambulatory Visit: Payer: PPO | Admitting: Student

## 2023-01-03 VITALS — BP 124/82 | HR 64 | Temp 97.7°F | Resp 18 | Ht 67.0 in | Wt 185.5 lb

## 2023-01-03 DIAGNOSIS — E7800 Pure hypercholesterolemia, unspecified: Secondary | ICD-10-CM

## 2023-01-03 DIAGNOSIS — Z6829 Body mass index (BMI) 29.0-29.9, adult: Secondary | ICD-10-CM | POA: Diagnosis not present

## 2023-01-03 DIAGNOSIS — R7303 Prediabetes: Secondary | ICD-10-CM | POA: Diagnosis not present

## 2023-01-03 DIAGNOSIS — H65111 Acute and subacute allergic otitis media (mucoid) (sanguinous) (serous), right ear: Secondary | ICD-10-CM | POA: Diagnosis not present

## 2023-01-03 DIAGNOSIS — I10 Essential (primary) hypertension: Secondary | ICD-10-CM | POA: Diagnosis not present

## 2023-01-03 DIAGNOSIS — Z0000 Encounter for general adult medical examination without abnormal findings: Secondary | ICD-10-CM | POA: Diagnosis not present

## 2023-01-03 DIAGNOSIS — R300 Dysuria: Secondary | ICD-10-CM

## 2023-01-03 HISTORY — DX: Acute and subacute allergic otitis media (mucoid) (sanguinous) (serous), right ear: H65.111

## 2023-01-03 HISTORY — DX: Encounter for general adult medical examination without abnormal findings: Z00.00

## 2023-01-03 HISTORY — DX: Dysuria: R30.0

## 2023-01-03 LAB — POCT URINALYSIS DIPSTICK
Bilirubin, UA: NEGATIVE
Blood, UA: NEGATIVE
Glucose, UA: NEGATIVE
Ketones, UA: NEGATIVE
Nitrite, UA: POSITIVE
Protein, UA: NEGATIVE
Spec Grav, UA: 1.015 (ref 1.010–1.025)
Urobilinogen, UA: 0.2 E.U./dL
pH, UA: 6 (ref 5.0–8.0)

## 2023-01-03 MED ORDER — CIPROFLOXACIN HCL 500 MG PO TABS: 500.0000 mg | ORAL_TABLET | Freq: Two times a day (BID) | ORAL | 0 refills | Status: AC

## 2023-01-03 NOTE — Assessment & Plan Note (Signed)
Continue monitoring BP at home, no changes made to lisinopril 5 mg and ASA 81 mg every three days.

## 2023-01-03 NOTE — Progress Notes (Addendum)
Preventive Screening-Counseling & Management     Tonya Baldwin is a 82 y.o. female who presents for Medicare Annual/Subsequent preventive. She is due for the following health maintenance studies: mammogram and screening labs. This patient's past medical history Hypertension, Prediabetes, Allergies, Anemia, Asthma, Hyperlipidemia, IBS (irritable bowel syndrome), Migraine, Osteopenia, and Restless Leg Syndrome.   HPI   Her last eye exam was 3 years ago, she does not have any complaints. She has had cataract surgery in the past. She did have some iron defiency anemia where they did a colonoscopy and EGD in 05/2010. Her colonoscopy was normal and they want to repeat this in 2022. Her EGD in 2012 was normal as well.  She does not want to repeat a colonoscopy at this time. Her last digital screening mammogram was done in 07/08/2020 and this was normal. She has a history of hystrectomy and unilateral oopherectomy due to endometriosis and ovarian cysts. The patient has resumed walking daily.  Her last DEXA scan was January 2022 T score -2.6 it was reccommended to follow up in 2 years. She does not wish to be screened again due to adverse reaction of medication.  She does not get yearly flu vaccines. She did have a pneumovax 23 and zostavax shingles vaccine in 2014. She has not completed Prevnar 13 vaccine.  She did have one shingrix vaccine in 05/2020. She has has 2 COVID-19 vaccines without any boosters. There is no problems with depression, anxiety or memory loss. She take an aspirin 81mg  po every 3 days.   There are some gaps in care as she took care of her husband whom she lost at the end of June due to brain cancer. There is a support system in place where her children and other family members check in frequently. She is coping well and denies depression.   Hypertension.  On 08/28/2022 she went to the ER with complaints of elevated BP  with readings 211 systolic. Patient has been taking Lisinopril as  prescribed. Denies any other complaints at that time. Pertinent exam findings include afebrile, BP 186/84 she was instructed to double up on  Lisinopril 2.5mg  po daily. She is currently taking 5 mg daily and monitoring her bp readings at home. The patient denies any headache, visual changes, dizziness, lightheadness, chest pain, shortness of breath, weakness/numbness, and edema. She reports there have been no other symptoms noted. She also noted a heart murmur on her this past year. She had an ECHO and this showed some mild concentric LVH with diastolic dysfunction with an LVEF of 60-65% with mild dilatation of her LA, and mild TR.   Prediabetes.  Last year, her HgBA1c was a bit worse and we discussed with her with starting metformin vs. diet/exercise. Most recent HgBa1c which was 6.2% where she was prediabetic. Since the last visit, there have been no problems. She remains on no medications; the diabetes is being managed by dietary intervention alone. She is also walking. She specifically denies unexplained abdominal pain, nausea or vomiting. She does not routinely check blood sugars. She came in fasting today in anticipation of lab work.    Cholesterol.  We have noted that her TC and LDL has remained elevated.  She has been on Lipitor in the past but stopped this due to nausea.  We also tried her on crestor which has now also caused her to have nausea.  She has been on pravastatin in the past but this made her nauseated as well.  Overall, she states  she is doing well and is without any complaints or problems at this time. She specifically denies abdominal pain, nausea, vomiting, diarrhea, myalgias, and fatigue. She remains on dietary management.  She is fasting in anticipation for labs today.   Dysuria: Today she reports a burning sensation at the end of urination for a week, she feels like she has a urinary tract infection. She denies any urinary incontinence, no hematuria, and no fevers. She says she  feels a ting of tenderness. She uses a daily pre/probiotic and eats yogurt daily.   Sore Throat: She reports waking up with a right side sore throat  and right ear pain. There is also some congestion with clear production. She uses Saline Solution nasal spray. There no are no fevers, body aches, or myalgias.      Are there smokers in your home (other than you)? No  Risk Factors Current exercise habits: Home exercise routine includes walking 1.5 hrs per day.  Dietary issues discussed: done    Depression Screen (Note: if answer to either of the following is "Yes", a more complete depression screening is indicated)   Over the past two weeks, have you felt down, depressed or hopeless? No  Over the past two weeks, have you felt little interest or pleasure in doing things? No  Have you lost interest or pleasure in daily life? No  Do you often feel hopeless? No  Do you cry easily over simple problems? No  Activities of Daily Living In your present state of health, do you have any difficulty performing the following activities?:  Driving? No Managing money?  No Feeding yourself? No Getting from bed to chair? No Climbing a flight of stairs? No Preparing food and eating?: No Bathing or showering? No Getting dressed: No Getting to the toilet? No Using the toilet:No Moving around from place to place: No In the past year have you fallen or had a near fall?:this year she had a near fall, a wire was down during Holiday representative.    Are you sexually active?  No  Do you have more than one partner?  No  Hearing Difficulties: No Do you often ask people to speak up or repeat themselves? Yes Do you experience ringing or noises in your ears? Yes Do you have difficulty understanding soft or whispered voices? No   Do you feel that you have a problem with memory? Yes  Do you often misplace items? No  Do you feel safe at home?  Yes  Cognitive Testing  Alert? Yes  Normal Appearance?Yes  Oriented to  person? Yes  Place? Yes   Time? Yes  Recall of three objects?  Yes  Can perform simple calculations? Yes  Displays appropriate judgment?Yes  Can read the correct time from a watch face?Yes  Fall Risk Prevention  Any stairs in or around the home? Yes  If so, are there any without handrails? Yes  Home free of loose throw rugs in walkways, pet beds, electrical cords, etc? Yes  Adequate lighting in your home to reduce risk of falls? Yes  Use of a cane, walker or w/c? No    Time Up and Go  Was the test performed? Yes .  Length of time to ambulate 10 feet: 13 sec.   Gait steady and fast without use of assistive device    Advanced Directives have been discussed with the patient? Yes   List the Names of Other Physician/Practitioners you currently use: Patient Care Team: Crist Fat, MD  as PCP - General (Internal Medicine)    Past Medical History:  Diagnosis Date   Anemia    Asthma    Hypercholesterolemia    IBS (irritable bowel syndrome)    Migraine    Osteopenia    Restless leg syndrome    Seasonal allergies     No past surgical history on file.    Current Medications  Current Outpatient Medications  Medication Sig Dispense Refill   aspirin EC 81 MG tablet Take 81 mg by mouth every 3 (three) days. Swallow whole.     ciprofloxacin (CIPRO) 500 MG tablet Take 1 tablet (500 mg total) by mouth 2 (two) times daily for 7 days. 14 tablet 0   lisinopril (ZESTRIL) 5 MG tablet Take 5 mg by mouth daily.     No current facility-administered medications for this visit.    Allergies Plum pulp and Sulfa antibiotics   Social History Social History   Tobacco Use   Smoking status: Never   Smokeless tobacco: Never  Substance Use Topics   Alcohol use: Not on file     Review of Systems Review of Systems  Constitutional:  Positive for chills. Negative for fever and weight loss.  HENT:  Positive for congestion, ear pain and sore throat.   Eyes: Negative.   Respiratory:  Negative.    Cardiovascular: Negative.   Gastrointestinal: Negative.   Genitourinary:  Positive for dysuria and frequency. Negative for hematuria.  Musculoskeletal: Negative.   Skin: Negative.   Neurological: Negative.   Endo/Heme/Allergies: Negative.   Psychiatric/Behavioral: Negative.       Physical Exam:      Body mass index is 29.05 kg/m. BP 124/82 (BP Location: Right Arm, Patient Position: Sitting, Cuff Size: Normal)   Pulse 64   Temp 97.7 F (36.5 C)   Resp 18   Ht 5\' 7"  (1.702 m)   Wt 185 lb 8 oz (84.1 kg)   SpO2 98%   BMI 29.05 kg/m   Physical Exam Vitals reviewed.  Constitutional:      Appearance: Normal appearance.  HENT:     Head: Normocephalic and atraumatic.     Right Ear: Ear canal and external ear normal. Tenderness present. A middle ear effusion is present.     Left Ear: Tympanic membrane, ear canal and external ear normal.     Nose: Congestion present.     Mouth/Throat:     Mouth: Mucous membranes are moist.     Pharynx: Oropharynx is clear. Posterior oropharyngeal erythema present. No oropharyngeal exudate.  Eyes:     Extraocular Movements: Extraocular movements intact.     Pupils: Pupils are equal, round, and reactive to light.  Cardiovascular:     Rate and Rhythm: Normal rate and regular rhythm.     Heart sounds: Normal heart sounds.  Pulmonary:     Effort: Pulmonary effort is normal. No respiratory distress.     Breath sounds: Normal breath sounds. No wheezing.  Abdominal:     General: Abdomen is flat. Bowel sounds are normal. There is no distension.     Palpations: Abdomen is soft.     Tenderness: There is no abdominal tenderness.  Musculoskeletal:        General: Normal range of motion.     Cervical back: Normal range of motion and neck supple.  Skin:    General: Skin is warm.     Capillary Refill: Capillary refill takes less than 2 seconds.  Neurological:     Mental Status: She  is alert and oriented to person, place, and time.      Cranial Nerves: Cranial nerve deficit present.     Sensory: Sensory deficit present.  Psychiatric:        Mood and Affect: Mood normal.      Assessment:      Essential hypertension - Plan: Comprehensive metabolic panel  Encounter for subsequent annual wellness visit (AWV) in Medicare patient  Dysuria - Plan: POCT urinalysis dipstick, Urine Culture, ciprofloxacin (CIPRO) 500 MG tablet  Prediabetes - Plan: Lipid Profile, Hemoglobin A1c  Hypercholesterolemia - Plan: VITAMIN D 25 Hydroxy (Vit-D Deficiency, Fractures)  Acute allergic otitis media of right ear, recurrence not specified    Plan:     During the course of the visit the patient was educated and counseled about appropriate screening and preventive services including:   Diabetes screening Nutrition counseling   Diet review for nutrition referral? Yes ____  Not Indicated __x__   Patient Instructions (the written plan) was given to the patient.  Essential hypertension Continue monitoring BP at home, no changes made to lisinopril 5 mg and ASA 81 mg every three days.   Prediabetes She should watch her diet and continue exercise as tolerated. She is fasting for A1c today.   Hypercholesterolemia Same as above, lipid panel done today.   Encounter for subsequent annual wellness visit (AWV) in Medicare patient During the course of the visit the patient was educated and counseled about appropriate screening and preventive services including:   Diabetes screening Nutrition counseling  Labs collected for screening and evaluation.   Dysuria Poct UA showed leukocytes and nitrates. She will take ciprofloxacin 500 mg twice day for 7 days.   Urine sent for culture.   Acute allergic otitis media of right ear She will take ciprofloxacin 500 mg twice day for 7 days for the UTI that should cover ear as well. She can continue using saline nose spray. I encourage a daily allergy med.     Essential hypertension Assessment &  Plan: Continue monitoring BP at home, no changes made to lisinopril 5 mg and ASA 81 mg every three days.   Orders: -     Comprehensive metabolic panel  Encounter for subsequent annual wellness visit (AWV) in Medicare patient Assessment & Plan: During the course of the visit the patient was educated and counseled about appropriate screening and preventive services including:   Diabetes screening Nutrition counseling  Labs collected for screening and evaluation.    Dysuria Assessment & Plan: Poct UA showed leukocytes and nitrates. She will take ciprofloxacin 500 mg twice day for 7 days.   Urine sent for culture.   Orders: -     POCT urinalysis dipstick -     Urine Culture -     Ciprofloxacin HCl; Take 1 tablet (500 mg total) by mouth 2 (two) times daily for 7 days.  Dispense: 14 tablet; Refill: 0  Prediabetes Assessment & Plan: She should watch her diet and continue exercise as tolerated. She is fasting for A1c today.   Orders: -     Lipid panel -     Hemoglobin A1c  Hypercholesterolemia Assessment & Plan: Same as above, lipid panel done today.   Orders: -     VITAMIN D 25 Hydroxy (Vit-D Deficiency, Fractures)  Acute allergic otitis media of right ear, recurrence not specified Assessment & Plan: She will take ciprofloxacin 500 mg twice day for 7 days for the UTI that should cover ear as well. She can continue  using saline nose spray. I encourage a daily allergy med.        Prevention   Medicare Attestation I have personally reviewed: The patient's medical and social history Their use of alcohol, tobacco or illicit drugs Their current medications and supplements The patient's functional ability including ADLs,fall risks, home safety risks, cognitive, and hearing and visual impairment Diet and physical activities Evidence for depression or mood disorders  The patient's weight, height, and BMI have been recorded in the chart.  I have made referrals,  counseling, and provided education to the patient based on review of the above and I have provided the patient with a written personalized care plan for preventive services.     Edwena Blow, NP   01/03/2023

## 2023-01-03 NOTE — Assessment & Plan Note (Addendum)
Poct UA showed leukocytes and nitrates. She will take ciprofloxacin 500 mg twice day for 7 days.   Urine sent for culture.

## 2023-01-03 NOTE — Assessment & Plan Note (Signed)
During the course of the visit the patient was educated and counseled about appropriate screening and preventive services including:   Diabetes screening Nutrition counseling  Labs collected for screening and evaluation.

## 2023-01-03 NOTE — Assessment & Plan Note (Signed)
Same as above, lipid panel done today.

## 2023-01-03 NOTE — Assessment & Plan Note (Signed)
She will take ciprofloxacin 500 mg twice day for 7 days for the UTI that should cover ear as well. She can continue using saline nose spray. I encourage a daily allergy med.

## 2023-01-03 NOTE — Assessment & Plan Note (Signed)
She should watch her diet and continue exercise as tolerated. She is fasting for A1c today.

## 2023-01-04 LAB — LIPID PANEL
Chol/HDL Ratio: 3.2 ratio (ref 0.0–4.4)
Cholesterol, Total: 262 mg/dL — ABNORMAL HIGH (ref 100–199)
HDL: 81 mg/dL (ref >39)
LDL Chol Calc (NIH): 166 mg/dL — ABNORMAL HIGH (ref 0–99)
Triglycerides: 88 mg/dL (ref 0–149)
VLDL Cholesterol Cal: 15 mg/dL (ref 5–40)

## 2023-01-04 LAB — COMPREHENSIVE METABOLIC PANEL
ALT: 14 IU/L (ref 0–32)
AST: 24 IU/L (ref 0–40)
Albumin: 4.4 g/dL (ref 3.8–4.8)
Alkaline Phosphatase: 81 IU/L (ref 44–121)
BUN/Creatinine Ratio: 20 (ref 12–28)
BUN: 19 mg/dL (ref 8–27)
Bilirubin Total: 0.3 mg/dL (ref 0.0–1.2)
CO2: 21 mmol/L (ref 20–29)
Calcium: 9.5 mg/dL (ref 8.7–10.3)
Chloride: 105 mmol/L (ref 96–106)
Creatinine, Ser: 0.93 mg/dL (ref 0.57–1.00)
Globulin, Total: 2.6 g/dL (ref 1.5–4.5)
Glucose: 107 mg/dL — ABNORMAL HIGH (ref 70–99)
Potassium: 5 mmol/L (ref 3.5–5.2)
Sodium: 142 mmol/L (ref 134–144)
Total Protein: 7 g/dL (ref 6.0–8.5)
eGFR: 62 mL/min/{1.73_m2} (ref >59)

## 2023-01-04 LAB — HEMOGLOBIN A1C
Est. average glucose Bld gHb Est-mCnc: 140 mg/dL
Hgb A1c MFr Bld: 6.5 % — ABNORMAL HIGH (ref 4.8–5.6)

## 2023-01-04 LAB — VITAMIN D 25 HYDROXY (VIT D DEFICIENCY, FRACTURES): Vit D, 25-Hydroxy: 44.3 ng/mL (ref 30.0–100.0)

## 2023-01-05 LAB — URINE CULTURE

## 2023-01-05 NOTE — Progress Notes (Signed)
I have reviewed the results of your recent test and lab work that you underwent at our clinic. Findings indicate stable vitamin D, cholesterol, and kidney function.   Based on the test results, I would like to recommend the following course of action to address any health concern:  The hemoglobin A1c has increased to 6.5 from 6.2, diabetes range. We should start Metformin 500 mg once daily.    Your cholesterol is slightly elevated. Try to avoid fried foods and fatty foods (red meats, pork products, whole milk products, and egg yolks) and try to eat more fresh fruits and vegetables.    I failed to complete a complete blood count. Due to your history of anemia, I need you to come back to draw that lab for comparison and evaluation.

## 2023-01-06 NOTE — Progress Notes (Signed)
I have reviewed your lab results for the urine culture. The culture is positive for E. Coli. The antibiotic that I have you on is sensitive to this organism. Continue current plan. I hope you feel better.

## 2023-01-06 NOTE — Progress Notes (Signed)
Leak, Luetta Nutting, NP  Rosalie Doctor Cc: Christianne Dolin, CMA I have reviewed your lab results for the urine culture. The culture is positive for E. Coli. The antibiotic that I have you on is sensitive to this organism. Continue current plan. I hope you feel better.  Patient informed.

## 2023-01-06 NOTE — Progress Notes (Signed)
Leak, Tonya Nutting, NP  Christianne Dolin, CMA I have reviewed the results of your recent test and lab work that you underwent at our clinic. Findings indicate stable vitamin D, cholesterol, and kidney function.  Based on the test results, I would like to recommend the following course of action to address any health concern: The hemoglobin A1c has increased to 6.5 from 6.2, diabetes range. We should start Metformin 500 mg once daily.    Your cholesterol is slightly elevated. Try to avoid fried foods and fatty foods (red meats, pork products, whole milk products, and egg yolks) and try to eat more fresh fruits and vegetables.    I failed to complete a complete blood count. Due to your history of anemia, I need you to come back to draw that lab for comparison and evaluation.  Pt. Informed, she doesn't want to start the Metformin just yet. She stated she was so busy taking care of her husband, she was neglecting herself. But now she has turned over a new leaf and is taking better care of herself.  She would like to wait and see how next blood test comes out next time to make a decision.  And, she will come in to due the cbc.

## 2023-01-13 ENCOUNTER — Other Ambulatory Visit: Payer: Self-pay

## 2023-01-13 MED ORDER — LISINOPRIL 5 MG PO TABS: 5.0000 mg | ORAL_TABLET | Freq: Every day | ORAL | 0 refills | Status: DC

## 2023-01-23 ENCOUNTER — Encounter: Payer: Self-pay | Admitting: Student

## 2023-04-05 ENCOUNTER — Ambulatory Visit: Payer: PPO | Admitting: Internal Medicine

## 2023-04-05 ENCOUNTER — Encounter: Payer: Self-pay | Admitting: Internal Medicine

## 2023-04-05 VITALS — BP 138/88 | HR 86 | Temp 98.4°F | Resp 17 | Ht 67.0 in | Wt 263.4 lb

## 2023-04-05 DIAGNOSIS — R5383 Other fatigue: Secondary | ICD-10-CM

## 2023-04-05 DIAGNOSIS — I10 Essential (primary) hypertension: Secondary | ICD-10-CM

## 2023-04-05 DIAGNOSIS — R7303 Prediabetes: Secondary | ICD-10-CM

## 2023-04-05 HISTORY — DX: Other fatigue: R53.83

## 2023-04-05 NOTE — Assessment & Plan Note (Signed)
She is having some fatigue. She has had anemia in the past and we will check some labs today.

## 2023-04-05 NOTE — Progress Notes (Signed)
Office Visit  Subjective   Patient ID: Tonya Baldwin   DOB: 1941-02-21   Age: 82 y.o.   MRN: [id number]   Chief Complaint Chief Complaint  Patient presents with   Follow-up     History of Present Illness The patient is a 82 year old Caucasian/White female who presents for a follow-up of hypertension.  This past year, she has multiple visits where her BP has been borderline.  She had problems with an elevated BP in 2019 where she was hospitalized.  She was started on lisinopril but her BP was low and she became symptomatic so we stopped her BP meds.  However, she still had some elevated BP where we restarted on low dose lisinopril. He also noted a heart murmur on her this past year.  We did an ECHO and this showed some mild concentric LVH with diastolic dysfunction with an LVEF of 60-65% with mild dilatation of her LA, and mild TR.  She does check her blood pressure at home.  She states her systolic BP runs [id number]'Z.  Her current medications include:  Lisinopril 5mg  po daily.  The patient denies any headache, visual changes, dizziness, lightheadness, chest pain, shortness of breath, weakness/numbness, and edema. She reports there have been no other symptoms noted.   The patient is a 82 year old Caucasian/White female who returns for a follow-up visit for her prediabetes.  She did see my NP about 3 months ago for her yearly exam and they noted her A1c went from 6.2 to 6.5.  They discussed starting on metformin but her husband was sick at that time.  She tells me today that he passed away 2 months ago.  She states she is doing ok in regards to this.  I have had discussions in the past about starting metformin vs. diet/exercise.  During her yearly exam in 12/2018 we noted that her random glucose was slightly elevated. She came in for a HgBa1c which was 6.2% where she was prediabetic. She remains on no medications; the diabetes is being managed by dietary intervention alone. She is also walking. She  specifically denies unexplained abdominal pain, nausea or vomiting. She does not routinely check blood sugars. She came in fasting today in anticipation of lab work. Her last HgbA1c was done 3 months ago and was 6.5%.       Past Medical History Past Medical History:  Diagnosis Date   Anemia    Asthma    Hypercholesterolemia    IBS (irritable bowel syndrome)    Migraine    Osteopenia    Restless leg syndrome    Seasonal allergies      Allergies Allergies  Allergen Reactions   Plum Pulp Other (See Comments)   Sulfa Antibiotics Dermatitis    Other reaction(s): RASH     Medications  Current Outpatient Medications:    aspirin EC 81 MG tablet, Take 81 mg by mouth every 3 (three) days. Swallow whole., Disp: , Rfl:    lisinopril (ZESTRIL) 5 MG tablet, Take 1 tablet (5 mg total) by mouth daily., Disp: 90 tablet, Rfl: 0   Review of Systems Review of Systems  Constitutional:  Negative for chills and fever.  Eyes:  Negative for blurred vision and double vision.  Respiratory:  Negative for cough and shortness of breath.   Cardiovascular:  Negative for chest pain, palpitations and leg swelling.  Gastrointestinal:  Negative for abdominal pain, constipation, diarrhea, nausea and vomiting.  Genitourinary:  Positive for frequency.  Neurological:  Negative for dizziness, weakness and headaches.  Endo/Heme/Allergies:  Negative for polydipsia.       Objective:    Vitals BP 138/88   Pulse 86   Temp 98.4 F (36.9 C)   Resp 17   Ht 5\' 7"  (1.702 m)   Wt 263 lb 6.4 oz (119.5 kg)   SpO2 98%   BMI 41.25 kg/m    Physical Examination Physical Exam Constitutional:      Appearance: Normal appearance. She is not ill-appearing.  Cardiovascular:     Rate and Rhythm: Normal rate and regular rhythm.     Pulses: Normal pulses.     Heart sounds: No murmur heard.    No friction rub. No gallop.  Pulmonary:     Effort: Pulmonary effort is normal. No respiratory distress.     Breath  sounds: No wheezing, rhonchi or rales.  Abdominal:     General: Bowel sounds are normal. There is no distension.     Palpations: Abdomen is soft.     Tenderness: There is no abdominal tenderness.  Musculoskeletal:     Right lower leg: No edema.     Left lower leg: No edema.  Skin:    General: Skin is warm and dry.     Findings: No rash.  Neurological:     Mental Status: She is alert.        Assessment & Plan:   Essential hypertension Her BP is under control.  We will continue her on lisinopril at her current dose.  Prediabetes We will repeat her HgBA1c today to make sure this is not worsening.  She is watching her diet and exercising as she can.  Other fatigue She is having some fatigue. She has had anemia in the past and we will check some labs today.    Return in about 3 months (around 07/06/2023).   Crist Fat, MD

## 2023-04-05 NOTE — Assessment & Plan Note (Signed)
We will repeat her HgBA1c today to make sure this is not worsening.  She is watching her diet and exercising as she can.

## 2023-04-05 NOTE — Assessment & Plan Note (Signed)
Her BP is under control.  We will continue her on lisinopril at her current dose.

## 2023-04-06 LAB — CBC WITH DIFFERENTIAL/PLATELET
Basophils Absolute: 0.1 10*3/uL (ref 0.0–0.2)
Basos: 1 %
EOS (ABSOLUTE): 0.3 10*3/uL (ref 0.0–0.4)
Eos: 4 %
Hematocrit: 36.3 % (ref 34.0–46.6)
Hemoglobin: 11.3 g/dL (ref 11.1–15.9)
Immature Grans (Abs): 0 10*3/uL (ref 0.0–0.1)
Immature Granulocytes: 0 %
Lymphocytes Absolute: 2 10*3/uL (ref 0.7–3.1)
Lymphs: 31 %
MCH: 25.6 pg — ABNORMAL LOW (ref 26.6–33.0)
MCHC: 31.1 g/dL — ABNORMAL LOW (ref 31.5–35.7)
MCV: 82 fL (ref 79–97)
Monocytes Absolute: 0.6 10*3/uL (ref 0.1–0.9)
Monocytes: 10 %
Neutrophils Absolute: 3.5 10*3/uL (ref 1.4–7.0)
Neutrophils: 54 %
Platelets: 352 10*3/uL (ref 150–450)
RBC: 4.41 x10E6/uL (ref 3.77–5.28)
RDW: 14.2 % (ref 11.7–15.4)
WBC: 6.5 10*3/uL (ref 3.4–10.8)

## 2023-04-06 LAB — HEMOGLOBIN A1C
Est. average glucose Bld gHb Est-mCnc: 137 mg/dL
Hgb A1c MFr Bld: 6.4 % — ABNORMAL HIGH (ref 4.8–5.6)

## 2023-04-06 LAB — VITAMIN B12: Vitamin B-12: 404 pg/mL (ref 232–1245)

## 2023-04-10 ENCOUNTER — Other Ambulatory Visit: Payer: Self-pay | Admitting: Student

## 2023-04-26 NOTE — Progress Notes (Signed)
Her labs look good.  Patient is aware

## 2023-06-25 DIAGNOSIS — I4891 Unspecified atrial fibrillation: Secondary | ICD-10-CM | POA: Diagnosis not present

## 2023-06-27 DIAGNOSIS — I361 Nonrheumatic tricuspid (valve) insufficiency: Secondary | ICD-10-CM | POA: Diagnosis not present

## 2023-06-28 ENCOUNTER — Ambulatory Visit: Payer: PPO | Admitting: Internal Medicine

## 2023-07-04 ENCOUNTER — Encounter: Payer: Self-pay | Admitting: Internal Medicine

## 2023-07-07 ENCOUNTER — Encounter: Payer: Self-pay | Admitting: Internal Medicine

## 2023-07-07 ENCOUNTER — Ambulatory Visit: Payer: No Typology Code available for payment source | Admitting: Internal Medicine

## 2023-07-07 VITALS — BP 138/90 | HR 66 | Temp 98.6°F | Resp 18 | Ht 67.0 in | Wt 181.0 lb

## 2023-07-07 DIAGNOSIS — T8149XA Infection following a procedure, other surgical site, initial encounter: Secondary | ICD-10-CM | POA: Diagnosis not present

## 2023-07-07 DIAGNOSIS — I4891 Unspecified atrial fibrillation: Secondary | ICD-10-CM

## 2023-07-07 HISTORY — DX: Infection following a procedure, other surgical site, initial encounter: T81.49XA

## 2023-07-07 HISTORY — DX: Unspecified atrial fibrillation: I48.91

## 2023-07-07 NOTE — Assessment & Plan Note (Signed)
 Her wound on my exam is slowly healing and there is no erythema.  Continue on her antibiotics and followup next week with surgeon.

## 2023-07-07 NOTE — Assessment & Plan Note (Signed)
 She had transient post-operative Atrial fibrillation where she is on ASA 81mg  every 3 days but no rate control meds.  This resolved with iv lopressor in the hospital.  We will refer her to cardiology.

## 2023-07-07 NOTE — Progress Notes (Signed)
 Office Visit  Subjective   Patient ID: Tonya Baldwin   DOB: 05/23/1940   Age: 83 y.o.   MRN: [id number]   Chief Complaint Chief Complaint  Patient presents with   Follow-up     History of Present Illness Tonya Baldwin is a 83 yo female who comes in today for a hospital followup where she was admitted to [facility] from 06/21/2023 until 06/28/2023 where she presented with n/v and abdominal pain.  She had a previous history of hystrectomy, cholecystectomy and appendectomy.  A CT scan of her abd/pevlis done in the ER showed dilatation of the small bowel without clear transition point or complete obstruction signs.  They thought this was an ileus vs partial small bowel obstruction which worsened and became a complete small bowel obstruction.  She did go for surgery with Dr. Lequita Halt on 06/22/2023 of lysis of adhesions and small bowel resection.  Her hospital course was complicated by acute hypoxia on 2/16 due to acute pulmonary edema and pleural effusions.  They did iv diuresis.  They noted she had mild demand ischemia with grey zone troponins.  This did resolve.  She also developed acute A. Fib on 06/26/2023 resolved with iv lopressor.  They tranitioned her home lisinopril to lopressor.  She also had acute gastritis which was helped with a PPI.  She tolerated a clear liquid diet and this was advanced to a regular diet.  The patient was discharged home where she felt better and her pedal edema and reflux problems were improved.  They recommend cardiology followup as an outpatient for her transient post-operativev A. Fib.  Today, she denies any fevers, chills, chest pain, SOB, palpitations, nausea, vomiting and she had diarrhea yesterday and today she had a semi formed BM.  She is still having some abdominal pain soreness.  She did see the surgeon for followup this week where they tell me that she was a lot of drainage from her wound.  They felt this was a wound infection and was placed on amoxicillin.  Her daugther is  packing the wound daily now.  She goes back next week to see the general surgeon.     Past Medical History Past Medical History:  Diagnosis Date   Anemia    Asthma    Hypercholesterolemia    IBS (irritable bowel syndrome)    Migraine    Osteopenia    Restless leg syndrome    Seasonal allergies      Allergies Allergies  Allergen Reactions   Plum Pulp Other (See Comments)   Sulfa Antibiotics Dermatitis    Other reaction(s): RASH     Medications  Current Outpatient Medications:    aspirin EC 81 MG tablet, Take 81 mg by mouth every 3 (three) days. Swallow whole., Disp: , Rfl:    lisinopril (ZESTRIL) 5 MG tablet, Take 1 tablet by mouth once daily, Disp: 90 tablet, Rfl: 0   Review of Systems Review of Systems  Constitutional:  Negative for chills and fever.  Respiratory:  Negative for shortness of breath.   Cardiovascular:  Negative for chest pain, palpitations and leg swelling.  Gastrointestinal:  Positive for abdominal pain and diarrhea. Negative for constipation, nausea and vomiting.  Musculoskeletal:  Negative for myalgias.  Skin:  Negative for rash.  Neurological:  Negative for dizziness and weakness.       Objective:    Vitals BP (!) 138/90   Pulse 66   Temp 98.6 F (37 C)   Resp 18  Ht 5\' 7"  (1.702 m)   Wt 181 lb (82.1 kg)   SpO2 94%   BMI 28.35 kg/m    Physical Examination Physical Exam Constitutional:      Appearance: Normal appearance. She is not ill-appearing.  Cardiovascular:     Rate and Rhythm: Normal rate and regular rhythm.     Pulses: Normal pulses.     Heart sounds: No murmur heard.    No friction rub. No gallop.  Pulmonary:     Effort: Pulmonary effort is normal. No respiratory distress.     Breath sounds: No wheezing, rhonchi or rales.  Abdominal:     General: Bowel sounds are normal. There is no distension.     Palpations: Abdomen is soft.     Tenderness: There is no abdominal tenderness.  Musculoskeletal:     Right lower leg:  No edema.     Left lower leg: No edema.  Skin:    General: Skin is warm and dry.     Findings: No rash.     Comments: She has a surgical wound in her midline suprapubic area with staples intact to the superior and inferior edges of the wound.  There is no staples in the mid region of the wound and there is bandage packing in place.  There is no erythema around the wound and I see no drainage.  Neurological:     Mental Status: She is alert.        Assessment & Plan:   Atrial fibrillation, transient (HCC) She had transient post-operative Atrial fibrillation where she is on ASA 81mg  every 3 days but no rate control meds.  This resolved with iv lopressor in the hospital.  We will refer her to cardiology.  Surgical wound infection Her wound on my exam is slowly healing and there is no erythema.  Continue on her antibiotics and followup next week with surgeon.      Return in about 4 weeks (around 08/04/2023).   Tonya Fat, MD

## 2023-07-11 ENCOUNTER — Encounter: Payer: Self-pay | Admitting: Cardiology

## 2023-07-11 DIAGNOSIS — D649 Anemia, unspecified: Secondary | ICD-10-CM | POA: Insufficient documentation

## 2023-07-11 DIAGNOSIS — J302 Other seasonal allergic rhinitis: Secondary | ICD-10-CM | POA: Insufficient documentation

## 2023-07-11 DIAGNOSIS — G2581 Restless legs syndrome: Secondary | ICD-10-CM | POA: Insufficient documentation

## 2023-07-11 DIAGNOSIS — J45909 Unspecified asthma, uncomplicated: Secondary | ICD-10-CM | POA: Insufficient documentation

## 2023-07-11 DIAGNOSIS — K589 Irritable bowel syndrome without diarrhea: Secondary | ICD-10-CM | POA: Insufficient documentation

## 2023-07-11 DIAGNOSIS — G43909 Migraine, unspecified, not intractable, without status migrainosus: Secondary | ICD-10-CM | POA: Insufficient documentation

## 2023-07-11 DIAGNOSIS — M8580 Other specified disorders of bone density and structure, unspecified site: Secondary | ICD-10-CM | POA: Insufficient documentation

## 2023-07-11 NOTE — Progress Notes (Deleted)
 Cardiology Office Note:    Date:  07/11/2023   ID:  Tonya Baldwin, DOB 23-Feb-1941, MRN [id number]  PCP:  Tonya Fat, MD  Cardiologist:  Tonya Herrlich, MD   Referring MD: Tonya Baldwin, Tonya Cower, MD  ASSESSMENT:    No diagnosis found. PLAN:    In order of problems listed above:  ***  Next appointment   Medication Adjustments/Labs and Tests Ordered: Current medicines are reviewed at length with the patient today.  Concerns regarding medicines are outlined above.  No orders of the defined types were placed in this encounter.  No orders of the defined types were placed in this encounter.    No chief complaint on file. ***  History of Present Illness:    Tonya Baldwin is a 83 y.o. female with hypertension with left ventricular hypertrophy who is being seen today for the evaluation of atrial fibrillation at the request of Tonya Fat, MD.  There is a notation from her PCP that she had transient postoperative atrial fibrillation resolved with IV Lopressor not anticoagulated and not on rate suppressing medication Past Medical History:  Diagnosis Date   Anemia    Asthma    Hypercholesterolemia    IBS (irritable bowel syndrome)    Migraine    Osteopenia    Restless leg syndrome    Seasonal allergies     No past surgical history on file.  Current Medications: No outpatient medications have been marked as taking for the 07/12/23 encounter (Appointment) with Tonya Daub, MD.     Allergies:   Plum pulp and Sulfa antibiotics   Social History   Socioeconomic History   Marital status: Married    Spouse name: Not on file   Number of children: Not on file   Years of education: Not on file   Highest education level: Not on file  Occupational History   Not on file  Tobacco Use   Smoking status: Never   Smokeless tobacco: Never  Substance and Sexual Activity   Alcohol use: Not on file   Drug use: Not on file   Sexual activity: Not on file  Other Topics Concern    Not on file  Social History Narrative   ** Merged History Encounter **       Social Drivers of Health   Financial Resource Strain: Not on file  Food Insecurity: Not on file  Transportation Needs: Not on file  Physical Activity: Not on file  Stress: Not on file  Social Connections: Not on file     Family History: The patient's ***family history is not on file.  ROS:   ROS Please see the history of present illness.    *** All other systems reviewed and are negative.  EKGs/Labs/Other Studies Reviewed:    The following studies were reviewed today: ***      EKG:  EKG is *** ordered today.  The ekg ordered today is personally reviewed and demonstrates ***  Recent Labs: 01/03/2023: ALT 14; BUN 19; Creatinine, Ser 0.93; Potassium 5.0; Sodium 142 04/05/2023: Hemoglobin 11.3; Platelets 352  Recent Lipid Panel    Component Value Date/Time   CHOL 262 (H) 01/03/2023 1100   TRIG 88 01/03/2023 1100   HDL 81 01/03/2023 1100   CHOLHDL 3.2 01/03/2023 1100   LDLCALC 166 (H) 01/03/2023 1100    Physical Exam:    VS:  There were no vitals taken for this visit.    Wt Readings from Last 3 Encounters:  07/07/23  181 lb (82.1 kg)  04/05/23 263 lb 6.4 oz (119.5 kg)  01/03/23 185 lb 8 oz (84.1 kg)     GEN: *** Well nourished, well developed in no acute distress HEENT: Normal NECK: No JVD; No carotid bruits LYMPHATICS: No lymphadenopathy CARDIAC: ***RRR, no murmurs, rubs, gallops RESPIRATORY:  Clear to auscultation without rales, wheezing or rhonchi  ABDOMEN: Soft, non-tender, non-distended MUSCULOSKELETAL:  No edema; No deformity  SKIN: Warm and dry NEUROLOGIC:  Alert and oriented x 3 PSYCHIATRIC:  Normal affect     Signed, Tonya Herrlich, MD  07/11/2023 12:33 PM    [facility]

## 2023-07-12 ENCOUNTER — Ambulatory Visit: Payer: No Typology Code available for payment source | Admitting: Cardiology

## 2023-07-12 DIAGNOSIS — G2581 Restless legs syndrome: Secondary | ICD-10-CM

## 2023-07-12 DIAGNOSIS — J302 Other seasonal allergic rhinitis: Secondary | ICD-10-CM

## 2023-07-19 NOTE — Progress Notes (Unsigned)
 Cardiology Office Note:    Date:  07/20/2023   ID:  Tonya Baldwin, DOB 09/01/1940, MRN [id number]  PCP:  Crist Fat, MD  Cardiologist:  Norman Herrlich, MD   Referring MD: Crist Fat, MD  ASSESSMENT:    1. Paroxysmal atrial fibrillation (HCC)   2. Hypertensive heart disease with heart failure (HCC)   3. Hypercholesterolemia    PLAN:    In order of problems listed above:  Fortunately maintaining sinus rhythm at this time with beta-blocker she will continue it and after counseling and reviewing the risk and options benefits risk is decided to transition from aspirin to Eliquis 5 mg twice daily Hypertension is controlled she will continue her beta-blocker no evidence of decompensated heart failure at this time is not on loop diuretic I will check a proBNP today Currently not on lipid-lowering therapy I asked her to have a cardiac CTA done and concerned about underlying CAD she just cannot make a decision she very worried about cost of her hospitalization and her daughter will discuss and if they decide to move forward they will send me a MyChart message Can also guide if she needs lipid-lowering therapy  Next appointment I will plan to see her in 6 weeks   Medication Adjustments/Labs and Tests Ordered: Current medicines are reviewed at length with the patient today.  Concerns regarding medicines are outlined above.  Orders Placed This Encounter  Procedures   Pro b natriuretic peptide (BNP)   CBC   EKG 12-Lead   Meds ordered this encounter  Medications   apixaban (ELIQUIS) 5 MG TABS tablet    Sig: Take 1 tablet (5 mg total) by mouth 2 (two) times daily.    Dispense:  180 tablet    Refill:  3     Chief Complaint  Patient presents with   Atrial Fibrillation    History of Present Illness:    Tonya Baldwin is a 83 y.o. female with hypertension with left ventricular hypertrophy who is being seen today for the evaluation of atrial fibrillation at the request of Crist Fat, MD. CHA2DS2-VASc score 3  There is a notation from her PCP that she had transient postoperative atrial fibrillation resolved with IV Lopressor not anticoagulated and not on rate suppressing medication.  She had a partial small bowel obstruction in February and her postoperative course was complicated by hypoxia with acute pulmonary edema and pleural effusions requiring IV diuresis there is a notation that her troponins were in the gray zone and she had atrial fibrillation that resolved after treatment with IV Lopressor.Her echocardiogram at [facility] 06/27/2023 showed a normal ejection fraction mild left atrial enlargement moderate elevation of pulmonary artery systolic fracture.  Her daughter is present participates in the evaluation and medical decision making Unfortunately are unaware that she was in heart failure in the hospital. She has had previous echocardiogram performed which showed mild LVH EF 60 to 65% with mild left atrial enlargement mild TR 2023 She relates she has had previous episodes of rapid heartbeat and has had them after the hospitalization not severe or sustained Previously she has had episodes of orthopnea and PND but not in the last few months She also has had intermittent episodes of chest tightness and headache she said it occurred during her hospitalization  She is feeling better now not having edema orthopnea palpitation or syncope however when she gets ready for church on Sunday does her ADLs and dressing she will have to sit  and rest for 10 to 15 minutes to complete the activity Past Medical History:  Diagnosis Date   Acute allergic otitis media of right ear 01/03/2023   Anemia    Asthma    Atrial fibrillation, transient (HCC) 07/07/2023   Dysuria 01/03/2023   Encounter for subsequent annual wellness visit (AWV) in Medicare patient 01/03/2023   Essential hypertension 04/18/2022   Hypercholesterolemia    IBS (irritable bowel syndrome)    Migraine     Osteopenia    Other fatigue 04/05/2023   Prediabetes 04/18/2022   Rectocele 09/14/2012   Restless leg syndrome    Seasonal allergies    Surgical wound infection 07/07/2023    History reviewed. No pertinent surgical history.  Current Medications: Current Meds  Medication Sig   apixaban (ELIQUIS) 5 MG TABS tablet Take 1 tablet (5 mg total) by mouth 2 (two) times daily.   metoprolol tartrate (LOPRESSOR) 50 MG tablet Take 50 mg by mouth 2 (two) times daily.   pantoprazole (PROTONIX) 40 MG tablet Take 40 mg by mouth daily.   [DISCONTINUED] aspirin EC 81 MG tablet Take 81 mg by mouth every 3 (three) days. Swallow whole.     Allergies:   Plum pulp and Sulfa antibiotics   Social History   Socioeconomic History   Marital status: Married    Spouse name: Not on file   Number of children: Not on file   Years of education: Not on file   Highest education level: Not on file  Occupational History   Not on file  Tobacco Use   Smoking status: Never   Smokeless tobacco: Never  Substance and Sexual Activity   Alcohol use: Not on file   Drug use: Not on file   Sexual activity: Not on file  Other Topics Concern   Not on file  Social History Narrative   ** Merged History Encounter **       Social Drivers of Health   Financial Resource Strain: Not on file  Food Insecurity: Not on file  Transportation Needs: Not on file  Physical Activity: Not on file  Stress: Not on file  Social Connections: Not on file     Family History: The patient's family history is not on file.  ROS:   ROS Please see the history of present illness.     All other systems reviewed and are negative.  EKGs/Labs/Other Studies Reviewed:    The following studies were reviewed today:   EKG Interpretation Date/Time:  Thursday July 20 2023 15:58:04 EDT Ventricular Rate:  52 PR Interval:  180 QRS Duration:  94 QT Interval:  426 QTC Calculation: 396 R Axis:   16  Text Interpretation: Sinus  bradycardia Otherwise normal ECG No previous ECGs available Confirmed by Norman Herrlich ([id number]) on 07/20/2023 3:59:15 PM    Recent Labs: 01/03/2023: ALT 14; BUN 19; Creatinine, Ser 0.93; Potassium 5.0; Sodium 142 04/05/2023: Hemoglobin 11.3; Platelets 352  Recent Lipid Panel    Component Value Date/Time   CHOL 262 (H) 01/03/2023 1100   TRIG 88 01/03/2023 1100   HDL 81 01/03/2023 1100   CHOLHDL 3.2 01/03/2023 1100   LDLCALC 166 (H) 01/03/2023 1100    Physical Exam:    VS:  BP (!) 140/90   Pulse (!) 52   Ht 5\' 7"  (1.702 m)   Wt 180 lb (81.6 kg)   SpO2 95%   BMI 28.19 kg/m     Wt Readings from Last 3 Encounters:  07/20/23 180 lb (81.6 kg)  07/07/23 181 lb (82.1 kg)  04/05/23 263 lb 6.4 oz (119.5 kg)     GEN: Appears her age well nourished, well developed in no acute distress HEENT: Normal NECK: No JVD; No carotid bruits LYMPHATICS: No lymphadenopathy CARDIAC: RRR, no murmurs, rubs, gallops RESPIRATORY:  Clear to auscultation without rales, wheezing or rhonchi  ABDOMEN: Soft, non-tender, non-distended MUSCULOSKELETAL:  No edema; No deformity  SKIN: Warm and dry NEUROLOGIC:  Alert and oriented x 3 PSYCHIATRIC:  Normal affect     Signed, Norman Herrlich, MD  07/20/2023 4:38 PM    [facility]

## 2023-07-20 ENCOUNTER — Ambulatory Visit: Attending: Cardiology | Admitting: Cardiology

## 2023-07-20 ENCOUNTER — Encounter: Payer: Self-pay | Admitting: Cardiology

## 2023-07-20 VITALS — BP 140/90 | HR 52 | Ht 67.0 in | Wt 180.0 lb

## 2023-07-20 DIAGNOSIS — I10 Essential (primary) hypertension: Secondary | ICD-10-CM

## 2023-07-20 DIAGNOSIS — I110 Hypertensive heart disease with heart failure: Secondary | ICD-10-CM

## 2023-07-20 DIAGNOSIS — E7800 Pure hypercholesterolemia, unspecified: Secondary | ICD-10-CM

## 2023-07-20 DIAGNOSIS — I480 Paroxysmal atrial fibrillation: Secondary | ICD-10-CM | POA: Diagnosis not present

## 2023-07-20 MED ORDER — APIXABAN 5 MG PO TABS: 5.0000 mg | ORAL_TABLET | Freq: Two times a day (BID) | ORAL | 3 refills | Status: DC

## 2023-07-20 NOTE — Patient Instructions (Signed)
 Medication Instructions:  Your physician has recommended you make the following change in your medication:   STOP: Aspirin START: Eliquis 5 mg two times daily  *If you need a refill on your cardiac medications before your next appointment, please call your pharmacy*   Lab Work: Your physician recommends that you return for lab work in:   Labs today: Pro BNP, CBC  If you have labs (blood work) drawn today and your tests are completely normal, you will receive your results only by: MyChart Message (if you have MyChart) OR A paper copy in the mail If you have any lab test that is abnormal or we need to change your treatment, we will call you to review the results.   Testing/Procedures: None   Follow-Up: At [facility], you and your health needs are our priority.  As part of our continuing mission to provide you with exceptional heart care, we have created designated Provider Care Teams.  These Care Teams include your primary Cardiologist (physician) and Advanced Practice Providers (APPs -  Physician Assistants and Nurse Practitioners) who all work together to provide you with the care you need, when you need it.  We recommend signing up for the patient portal called "MyChart".  Sign up information is provided on this After Visit Summary.  MyChart is used to connect with patients for Virtual Visits (Telemedicine).  Patients are able to view lab/test results, encounter notes, upcoming appointments, etc.  Non-urgent messages can be sent to your provider as well.   To learn more about what you can do with MyChart, go to [email or web address].    Your next appointment:   6 week(s)  Provider:   Norman Herrlich, MD    Other Instructions None

## 2023-07-23 ENCOUNTER — Encounter: Payer: Self-pay | Admitting: Cardiology

## 2023-07-26 LAB — CBC
Hematocrit: 32.1 % — ABNORMAL LOW (ref 34.0–46.6)
Hemoglobin: 9.9 g/dL — ABNORMAL LOW (ref 11.1–15.9)
MCH: 25.1 pg — ABNORMAL LOW (ref 26.6–33.0)
MCHC: 30.8 g/dL — ABNORMAL LOW (ref 31.5–35.7)
MCV: 82 fL (ref 79–97)
Platelets: 341 10*3/uL (ref 150–450)
RBC: 3.94 x10E6/uL (ref 3.77–5.28)
RDW: 14.2 % (ref 11.7–15.4)
WBC: 5.8 10*3/uL (ref 3.4–10.8)

## 2023-07-26 LAB — PRO B NATRIURETIC PEPTIDE: NT-Pro BNP: 279 pg/mL (ref 0–738)

## 2023-07-28 ENCOUNTER — Telehealth: Payer: Self-pay

## 2023-07-28 DIAGNOSIS — D649 Anemia, unspecified: Secondary | ICD-10-CM

## 2023-07-28 NOTE — Telephone Encounter (Signed)
 MyChart message

## 2023-07-28 NOTE — Telephone Encounter (Signed)
-----   Message from [place] sent at 07/26/2023 10:06 AM EDT ----- Regarding: FW: She is anemic but looks chronic I like her to return to have iron studies ferritin iron iron saturation and TIBC performed. ----- Message ----- From: Interface, [facility] Lab Results In Sent: 07/26/2023   7:38 AM EDT To: Baldo Daub, MD

## 2023-08-01 ENCOUNTER — Other Ambulatory Visit: Payer: Self-pay

## 2023-08-01 MED ORDER — METOPROLOL TARTRATE 50 MG PO TABS: 50.0000 mg | ORAL_TABLET | Freq: Two times a day (BID) | ORAL | 1 refills | Status: DC

## 2023-08-01 NOTE — Progress Notes (Signed)
 Rx refill

## 2023-08-02 ENCOUNTER — Other Ambulatory Visit: Payer: Self-pay | Admitting: Cardiology

## 2023-08-02 ENCOUNTER — Other Ambulatory Visit: Payer: Self-pay

## 2023-08-03 LAB — IRON AND TIBC
Iron Saturation: 6 % — CL (ref 15–55)
Iron: 26 ug/dL — ABNORMAL LOW (ref 27–139)
Total Iron Binding Capacity: 411 ug/dL (ref 250–450)
UIBC: 385 ug/dL — ABNORMAL HIGH (ref 118–369)

## 2023-08-03 LAB — FERRITIN: Ferritin: 13 ng/mL — ABNORMAL LOW (ref 15–150)

## 2023-08-04 ENCOUNTER — Encounter: Payer: Self-pay | Admitting: Internal Medicine

## 2023-08-04 ENCOUNTER — Ambulatory Visit: Payer: No Typology Code available for payment source | Admitting: Internal Medicine

## 2023-08-04 VITALS — BP 150/76 | HR 55 | Temp 98.9°F | Resp 18 | Ht 67.0 in | Wt 182.8 lb

## 2023-08-04 DIAGNOSIS — I4891 Unspecified atrial fibrillation: Secondary | ICD-10-CM | POA: Diagnosis not present

## 2023-08-04 DIAGNOSIS — I4892 Unspecified atrial flutter: Secondary | ICD-10-CM | POA: Insufficient documentation

## 2023-08-04 DIAGNOSIS — T148XXA Other injury of unspecified body region, initial encounter: Secondary | ICD-10-CM | POA: Insufficient documentation

## 2023-08-04 DIAGNOSIS — D509 Iron deficiency anemia, unspecified: Secondary | ICD-10-CM

## 2023-08-04 HISTORY — DX: Unspecified atrial flutter: I48.92

## 2023-08-04 HISTORY — DX: Other injury of unspecified body region, initial encounter: T14.8XXA

## 2023-08-04 MED ORDER — ACCRUFER 30 MG PO CAPS: 1.0000 | ORAL_CAPSULE | Freq: Two times a day (BID) | ORAL | 2 refills | Status: DC

## 2023-08-04 NOTE — Assessment & Plan Note (Signed)
 She is on metoprolol for rate control and seems to be doing well.  I have asked her not to take the eliquis until I figure out what her iron deficiency anemia is from.   She is to remain on an ASA 81mg  daily.  Notes from cardiology were reviewed.

## 2023-08-04 NOTE — Assessment & Plan Note (Signed)
 She had an infected surgical wound on her last visit but today this is not infected and is healing well.  She is having normal BM's.  She goes back to see Dr. Lequita Halt next week for followup.

## 2023-08-04 NOTE — Progress Notes (Signed)
 Office Visit  Subjective   Patient ID: Tonya Baldwin   DOB: 1940/05/12   Age: 83 y.o.   MRN: [id number]   Chief Complaint Chief Complaint  Patient presents with   Follow-up    4 Week F/U     History of Present Illness Tonya Baldwin is a 83 yo female who returns today for a 1 month followup where she was hospitalized in 06/2023 with bowel obstruction vs. Ileus.  She presented with n/v and abdominal pain.  She had a previous history of hystrectomy, cholecystectomy and appendectomy.  A CT scan of her abd/pevlis done in the ER showed dilatation of the small bowel without clear transition point or complete obstruction signs.  They thought this was an ileus vs partial small bowel obstruction which worsened and became a complete small bowel obstruction.  She did go for surgery with Dr. Lequita Halt on 06/22/2023 of lysis of adhesions and small bowel resection.  Her hospital course was complicated by acute hypoxia on 2/16 due to acute pulmonary edema and pleural effusions.  They did iv diuresis.  They noted she had mild demand ischemia with grey zone troponins.  This did resolve.  She also developed acute A. Fib on 06/26/2023 resolved with iv lopressor.  They tranitioned her home lisinopril to lopressor.  She also had acute gastritis which was helped with a PPI.  She tolerated a clear liquid diet and this was advanced to a regular diet.  The patient was discharged home where she felt better and her pedal edema and reflux problems were improved.  They recommend cardiology followup as an outpatient for her transient post-operativev A. Fib.  She did see the surgeon for followup this week where they tell me that she was a lot of drainage from her wound.  They felt this was a wound infection and was placed on amoxicillin.  Her daugther was packing the wound daily and the wound is almost healed up now.  Due to her transient A. Fib, I did refer her to cardiology who saw her on 07/20/2023.  He felt she was fortunately  maintaining sinus rhythm at that time with beta-blocker.  They decided to transition her from ASA to eliquis 5mg  BID as her CHAD2VASC score was a 3.  They discussed a coronary CTA to be done as he was concerned about underlying CAD.  She did not make a decision she very worried about cost of her hospitalization and she wanted to discuss this with her daughter.   He did further workup and she was noted to have anemia.  Her HgB in 03/2023 was 11.3 and on 07/25/2023 her HgB was 9.9.  He did iron studies on her where her iron was 26 and ferritin level was 13.  She has had iron deficiency anemia in the past in 2012 where they did a colonoscopy and EGD in 05/2010. Her colonoscopy and EGD at that time was normal and they wanted to repeat this in 2022. She has told me in the past that she did not want to repeat a colonoscopy at that time.  Today, she denies any BRBPR, melena, hematuria, hemoptysis or other problems.  She has not started the eliquis yet.  This patient has a history of transient atrial fibrillation when she was hospitalized in 06/2023.  They gave her a dose of iv lopressor in the hospital and this resolved.  They tranitioned her home lisinopril to lopressor.  Her echocardiogram at [facility] was done on 06/27/2023 showed a  normal ejection fraction mild left atrial enlargement moderate elevation of pulmonary artery systolic pressure.  She has had previous echocardiogram performed which showed mild LVH EF 60 to 65% with mild left atrial enlargement mild TR 2023.  Her atrial fibrillation is controlled with therapy as summarized in the medication list and previous notes. Specifically denied complaints: chest pain, palpitations, SOB, generalized weakness, syncope or other problems.   She remains on ASA 81mg  daily.      Past Medical History Past Medical History:  Diagnosis Date   Acute allergic otitis media of right ear 01/03/2023   Anemia    Asthma    Atrial fibrillation, transient (HCC) 07/07/2023    Dysuria 01/03/2023   Encounter for subsequent annual wellness visit (AWV) in Medicare patient 01/03/2023   Essential hypertension 04/18/2022   Hypercholesterolemia    IBS (irritable bowel syndrome)    Migraine    Osteopenia    Other fatigue 04/05/2023   Prediabetes 04/18/2022   Rectocele 09/14/2012   Restless leg syndrome    Seasonal allergies    Surgical wound infection 07/07/2023     Allergies Allergies  Allergen Reactions   Plum Pulp Other (See Comments)   Sulfa Antibiotics Dermatitis    Other reaction(s): RASH     Medications  Current Outpatient Medications:    apixaban (ELIQUIS) 5 MG TABS tablet, Take 1 tablet (5 mg total) by mouth 2 (two) times daily., Disp: 180 tablet, Rfl: 3   metoprolol tartrate (LOPRESSOR) 50 MG tablet, Take 1 tablet (50 mg total) by mouth 2 (two) times daily., Disp: 90 tablet, Rfl: 1   pantoprazole (PROTONIX) 40 MG tablet, Take 40 mg by mouth daily., Disp: , Rfl:    Review of Systems Review of Systems  Constitutional:  Positive for malaise/fatigue. Negative for chills and fever.  Eyes:  Negative for blurred vision and double vision.  Respiratory:  Negative for cough, hemoptysis, shortness of breath and wheezing.   Cardiovascular:  Positive for leg swelling. Negative for chest pain and palpitations.  Gastrointestinal:  Negative for abdominal pain, blood in stool, constipation, diarrhea, melena, nausea and vomiting.  Genitourinary:  Negative for hematuria.  Musculoskeletal:  Negative for myalgias.  Skin:  Negative for rash.  Neurological:  Negative for dizziness, weakness and headaches.  Endo/Heme/Allergies:  Negative for polydipsia.       Objective:    Vitals BP (!) 150/76   Pulse (!) 55   Temp 98.9 F (37.2 C) (Oral)   Resp 18   Ht 5\' 7"  (1.702 m)   Wt 182 lb 12.8 oz (82.9 kg)   SpO2 97%   BMI 28.63 kg/m    Physical Examination Physical Exam Constitutional:      Appearance: Normal appearance. She is not ill-appearing.   Cardiovascular:     Rate and Rhythm: Normal rate and regular rhythm.     Pulses: Normal pulses.     Heart sounds: No murmur heard.    No friction rub. No gallop.  Pulmonary:     Effort: Pulmonary effort is normal. No respiratory distress.     Breath sounds: No wheezing, rhonchi or rales.  Abdominal:     General: Bowel sounds are normal. There is no distension.     Palpations: Abdomen is soft.     Tenderness: There is no abdominal tenderness.  Musculoskeletal:     Right lower leg: No edema.     Left lower leg: No edema.  Skin:    General: Skin is warm and dry.  Findings: No rash.  Neurological:     General: No focal deficit present.     Mental Status: She is alert and oriented to person, place, and time.  Psychiatric:        Mood and Affect: Mood normal.        Behavior: Behavior normal.        Assessment & Plan:   Atrial fibrillation, transient (HCC) She is on metoprolol for rate control and seems to be doing well.  I have asked her not to take the eliquis until I figure out what her iron deficiency anemia is from.   She is to remain on an ASA 81mg  daily.  Notes from cardiology were reviewed.  Anemia She has iron deficiency anemia.  I have given her 3 hemoccult cards which I want her to bring back on Monday.  Her iron and ferritin levels are low.  She needs iv iron and I will set her up for ferreheme and I have given her samples of accufer.  We will recheck her iron level and CBC in 4-6 weeks.  Surgical wound present She had an infected surgical wound on her last visit but today this is not infected and is healing well.  She is having normal BM's.  She goes back to see Dr. Lequita Halt next week for followup.    Return in about 5 weeks (around 09/08/2023).   Crist Fat, MD

## 2023-08-04 NOTE — Assessment & Plan Note (Signed)
 She has iron deficiency anemia.  I have given her 3 hemoccult cards which I want her to bring back on Monday.  Her iron and ferritin levels are low.  She needs iv iron and I will set her up for ferreheme and I have given her samples of accufer.  We will recheck her iron level and CBC in 4-6 weeks.

## 2023-08-07 ENCOUNTER — Ambulatory Visit: Admitting: Internal Medicine

## 2023-08-07 DIAGNOSIS — D509 Iron deficiency anemia, unspecified: Secondary | ICD-10-CM | POA: Diagnosis not present

## 2023-08-07 LAB — HEMOCCULT GUIAC POC 1CARD (OFFICE): Fecal Occult Blood, POC: POSITIVE — AB

## 2023-08-07 NOTE — Progress Notes (Signed)
 Nurse Visit Occult Blood Stool

## 2023-08-10 ENCOUNTER — Other Ambulatory Visit: Payer: Self-pay

## 2023-08-10 DIAGNOSIS — D509 Iron deficiency anemia, unspecified: Secondary | ICD-10-CM

## 2023-08-10 MED ORDER — FERROUS SULFATE 325 (65 FE) MG PO TABS: 325.0000 mg | ORAL_TABLET | Freq: Every day | ORAL | 3 refills | Status: DC

## 2023-08-10 NOTE — Progress Notes (Signed)
 Rx refill

## 2023-08-17 DIAGNOSIS — D509 Iron deficiency anemia, unspecified: Secondary | ICD-10-CM | POA: Diagnosis not present

## 2023-08-24 DIAGNOSIS — I4891 Unspecified atrial fibrillation: Secondary | ICD-10-CM | POA: Diagnosis not present

## 2023-08-24 DIAGNOSIS — Z9049 Acquired absence of other specified parts of digestive tract: Secondary | ICD-10-CM | POA: Diagnosis not present

## 2023-08-24 DIAGNOSIS — D509 Iron deficiency anemia, unspecified: Secondary | ICD-10-CM | POA: Diagnosis not present

## 2023-08-24 DIAGNOSIS — R5383 Other fatigue: Secondary | ICD-10-CM | POA: Diagnosis not present

## 2023-08-24 DIAGNOSIS — K5900 Constipation, unspecified: Secondary | ICD-10-CM | POA: Diagnosis not present

## 2023-08-24 DIAGNOSIS — K644 Residual hemorrhoidal skin tags: Secondary | ICD-10-CM | POA: Diagnosis not present

## 2023-09-01 DIAGNOSIS — L820 Inflamed seborrheic keratosis: Secondary | ICD-10-CM | POA: Diagnosis not present

## 2023-09-01 DIAGNOSIS — L814 Other melanin hyperpigmentation: Secondary | ICD-10-CM | POA: Diagnosis not present

## 2023-09-01 DIAGNOSIS — L821 Other seborrheic keratosis: Secondary | ICD-10-CM | POA: Diagnosis not present

## 2023-09-07 DIAGNOSIS — D649 Anemia, unspecified: Secondary | ICD-10-CM | POA: Diagnosis not present

## 2023-09-07 DIAGNOSIS — D510 Vitamin B12 deficiency anemia due to intrinsic factor deficiency: Secondary | ICD-10-CM | POA: Diagnosis not present

## 2023-09-08 ENCOUNTER — Ambulatory Visit: Admitting: Cardiology

## 2023-09-08 ENCOUNTER — Ambulatory Visit: Payer: Self-pay

## 2023-09-08 ENCOUNTER — Ambulatory Visit: Admitting: Internal Medicine

## 2023-09-11 ENCOUNTER — Ambulatory Visit: Admitting: Internal Medicine

## 2023-09-11 ENCOUNTER — Encounter: Payer: Self-pay | Admitting: Internal Medicine

## 2023-09-11 VITALS — BP 122/72 | HR 56 | Temp 98.4°F | Resp 18 | Ht 67.0 in | Wt 182.6 lb

## 2023-09-11 DIAGNOSIS — D509 Iron deficiency anemia, unspecified: Secondary | ICD-10-CM | POA: Diagnosis not present

## 2023-09-11 NOTE — Progress Notes (Signed)
 Office Visit  Subjective   Patient ID: Tonya Baldwin   DOB: 06-15-1940   Age: 83 y.o.   MRN: [id number]   Chief Complaint Chief Complaint  Patient presents with   Follow-up     History of Present Illness Tonya Baldwin is a 83 yo female who returns today for followup where she was hospitalized in 06/2023 with bowel obstruction vs. Ileus.  She presented to the hospital with n/v and abdominal pain.  She had a previous history of hystrectomy, cholecystectomy and appendectomy.  A CT scan of her abd/pevlis done in the ER showed dilatation of the small bowel without clear transition point or complete obstruction signs.  They thought this was an ileus vs partial small bowel obstruction which worsened and became a complete small bowel obstruction.  She did go for surgery with Dr. Britta Candy on 06/22/2023 of lysis of adhesions and small bowel resection.  Her hospital course was complicated by acute hypoxia on 2/16 due to acute pulmonary edema and pleural effusions.  They did iv diuresis.  They noted she had mild demand ischemia with grey zone troponins.  This did resolve.  She also developed acute A. Fib on 06/26/2023 resolved with iv lopressor .  They tranitioned her home lisinopril  to lopressor .  She also had acute gastritis which was helped with a PPI.  She tolerated a clear liquid diet and this was advanced to a regular diet.  The patient was discharged home where she felt better and her pedal edema and reflux problems were improved.  They recommend cardiology followup as an outpatient for her transient post-operative A. Fib.  I saw her over a month ago and she had some drainage from her wound and cardiology saw this as well and her surgeon.  They felt this was a wound infection and was placed on amoxicillin.  Her daugther was packing the wound daily and the wound is now completely healed.  She was found to be anemic on her followup in 07/2023 with Dr. Sandee Crook.  She had iron studies done and this showed on 08/02/2023  an iron of 26 and ferrritin level of 13.  Her HgB level was 9.9 around that time.  I did set her up for iv iron infusion where she had 2 infusions and we also started her on accufer 30mg  BID.  I sent 3 hemoccult stool cards out and these were positive.  I did send her to see Dr. Monico Anna in 08/2023 where he repeated these cards and they were negative.  They reordered iron studies which were done on 09/07/2023 with iron level of 11.8 and ferritin level of 228.  Her HgB was 11.8.       Past Medical History Past Medical History:  Diagnosis Date   Acute allergic otitis media of right ear 01/03/2023   Anemia    Asthma    Atrial fib/flutter, transient (HCC) 08/04/2023   Atrial fibrillation, transient (HCC) 07/07/2023   Dysuria 01/03/2023   Encounter for subsequent annual wellness visit (AWV) in Medicare patient 01/03/2023   Essential hypertension 04/18/2022   Hypercholesterolemia    IBS (irritable bowel syndrome)    Migraine    Osteopenia    Other fatigue 04/05/2023   Prediabetes 04/18/2022   Rectocele 09/14/2012   Restless leg syndrome    Seasonal allergies    Surgical wound infection 07/07/2023   Surgical wound present 08/04/2023     Allergies Allergies  Allergen Reactions   Plum Pulp Other (See Comments)   Sulfa Antibiotics  Dermatitis    Other reaction(s): RASH     Medications  Current Outpatient Medications:    apixaban  (ELIQUIS ) 5 MG TABS tablet, Take 1 tablet (5 mg total) by mouth 2 (two) times daily., Disp: 180 tablet, Rfl: 3   ferrous sulfate  325 (65 FE) MG tablet, Take 1 tablet (325 mg total) by mouth daily., Disp: 30 tablet, Rfl: 3   metoprolol  tartrate (LOPRESSOR ) 50 MG tablet, Take 1 tablet (50 mg total) by mouth 2 (two) times daily., Disp: 90 tablet, Rfl: 1   Review of Systems Review of Systems  Constitutional:  Negative for chills and fever.  Eyes:  Negative for blurred vision and double vision.  Respiratory:  Negative for shortness of breath.   Cardiovascular:   Negative for chest pain, palpitations and leg swelling.  Gastrointestinal:  Negative for abdominal pain, blood in stool, constipation, diarrhea, melena, nausea and vomiting.  Neurological:  Negative for dizziness, weakness and headaches.       Objective:    Vitals BP 122/72   Pulse (!) 56   Temp 98.4 F (36.9 C)   Resp 18   Ht 5\' 7"  (1.702 m)   Wt 182 lb 9.6 oz (82.8 kg)   SpO2 96%   BMI 28.60 kg/m    Physical Examination Physical Exam Constitutional:      Appearance: Normal appearance. She is not ill-appearing.  Cardiovascular:     Rate and Rhythm: Normal rate and regular rhythm.     Pulses: Normal pulses.     Heart sounds: No murmur heard.    No friction rub. No gallop.  Pulmonary:     Effort: Pulmonary effort is normal. No respiratory distress.     Breath sounds: No wheezing, rhonchi or rales.  Abdominal:     General: Bowel sounds are normal. There is no distension.     Palpations: Abdomen is soft.     Tenderness: There is no abdominal tenderness.  Musculoskeletal:     Right lower leg: No edema.     Left lower leg: No edema.  Skin:    General: Skin is warm and dry.     Findings: No rash.  Neurological:     Mental Status: She is alert.        Assessment & Plan:   Anemia I have reviewed her labs from [facility] and she has good iron reserves and her HgB is almost back to normal.  She can stop the accufer.  Her abdominal surgical wound is healed.  She will talk tomorrow to cardiology regarding her A. Fib and switching to eliquis .    Return in about 3 months (around 12/12/2023).   Wayne Haines, MD

## 2023-09-11 NOTE — Assessment & Plan Note (Signed)
 I have reviewed her labs from [facility] and she has good iron reserves and her HgB is almost back to normal.  She can stop the accufer.  Her abdominal surgical wound is healed.  She will talk tomorrow to cardiology regarding her A. Fib and switching to eliquis .

## 2023-09-12 ENCOUNTER — Ambulatory Visit

## 2023-09-12 ENCOUNTER — Ambulatory Visit: Payer: Self-pay

## 2023-09-12 VITALS — BP 134/62 | HR 50 | Ht 67.0 in | Wt 184.0 lb

## 2023-09-12 DIAGNOSIS — I10 Essential (primary) hypertension: Secondary | ICD-10-CM | POA: Diagnosis not present

## 2023-09-12 DIAGNOSIS — I480 Paroxysmal atrial fibrillation: Secondary | ICD-10-CM | POA: Diagnosis not present

## 2023-09-12 DIAGNOSIS — I4891 Unspecified atrial fibrillation: Secondary | ICD-10-CM | POA: Diagnosis not present

## 2023-09-12 MED ORDER — METOPROLOL TARTRATE 25 MG PO TABS: 25.0000 mg | ORAL_TABLET | Freq: Two times a day (BID) | ORAL | 3 refills | Status: DC

## 2023-09-12 NOTE — Patient Instructions (Addendum)
 Medication Instructions:   Decrease Metoprolol  to 25 mg twice a day   Discontinue Eliquis .  *If you need a refill on your cardiac medications before your next appointment, please call your pharmacy*   Lab Work: None Ordered If you have labs (blood work) drawn today and your tests are completely normal, you will receive your results only by: MyChart Message (if you have MyChart) OR A paper copy in the mail If you have any lab test that is abnormal or we need to change your treatment, we will call you to review the results.   Testing/Procedures: A zio monitor was ordered today. It will remain on for 14 days. Remove 09/26/2023. You will then return monitor and event diary in provided box. It takes 1-2 weeks for report to be downloaded and returned to us . We will call you with the results. If monitor falls off or has orange flashing light, please call Zio for further instructions.     Follow-Up: At [facility], you and your health needs are our priority.  As part of our continuing mission to provide you with exceptional heart care, we have created designated Provider Care Teams.  These Care Teams include your primary Cardiologist (physician) and Advanced Practice Providers (APPs -  Physician Assistants and Nurse Practitioners) who all work together to provide you with the care you need, when you need it.  We recommend signing up for the patient portal called "MyChart".  Sign up information is provided on this After Visit Summary.  MyChart is used to connect with patients for Virtual Visits (Telemedicine).  Patients are able to view lab/test results, encounter notes, upcoming appointments, etc.  Non-urgent messages can be sent to your provider as well.   To learn more about what you can do with MyChart, go to [email or web address].    Your next appointment:   3 month follow up

## 2023-09-12 NOTE — Assessment & Plan Note (Signed)
 Transient postoperative atrial fibrillation in February 2025.  Has remained in sinus rhythm since. CHA2DS2-VASc score elevated 4 [age, woman, hypertension] High risk for bleeding with recent anemia and signs of GI blood losses.  Currently stable from anemia standpoint.  Will assess for any significant A-fib burden with a 14-day Zio patch monitor. If this confirms significant A-fib burden, we will discussed long-term anticoagulation requirement.  If no significant A-fib burden, can hold off on anticoagulation for now and consider long-term monitoring with a loop recorder to identify significant A-fib burden promptly.  Reduce the dose of metoprolol  to tartrate to 25 mg twice daily due to baseline bradycardia.

## 2023-09-12 NOTE — Assessment & Plan Note (Signed)
 Well-controlled. Target below 140/90 mmHg. Continue metoprolol  tartrate, lowered the dose to 25 mg twice daily.

## 2023-09-12 NOTE — Progress Notes (Signed)
 Cardiology Consultation:    Date:  09/12/2023   ID:  Woodward Head, DOB 08/05/1940, MRN [id number]  PCP:  Wayne Haines, MD  Cardiologist:  Daymon Evans Syeda Prickett, MD   Referring MD: Wayne Haines, MD   No chief complaint on file.    ASSESSMENT AND PLAN:   [name] 83 year old woman with history of hypertension, hyperlipidemia, transient postop atrial fibrillation in February 2025 during partial small bowel obstruction related surgery, has remained in sinus rhythm since with short intermittent episodes of palpitations, relatively slow heart rates at baseline which likely are contributing to her symptoms of lightheadedness and the setting of metoprolol  tartrate use.  Problem List Items Addressed This Visit     Essential hypertension   Well-controlled. Target below 140/90 mmHg. Continue metoprolol  tartrate, lowered the dose to 25 mg twice daily.       Relevant Medications   aspirin EC 81 MG tablet   metoprolol  tartrate (LOPRESSOR ) 25 MG tablet   Atrial fibrillation, transient (HCC) - Primary   Transient postoperative atrial fibrillation in February 2025.  Has remained in sinus rhythm since. CHA2DS2-VASc score elevated 4 [age, woman, hypertension] High risk for bleeding with recent anemia and signs of GI blood losses.  Currently stable from anemia standpoint.  Will assess for any significant A-fib burden with a 14-day Zio patch monitor. If this confirms significant A-fib burden, we will discussed long-term anticoagulation requirement.  If no significant A-fib burden, can hold off on anticoagulation for now and consider long-term monitoring with a loop recorder to identify significant A-fib burden promptly.  Reduce the dose of metoprolol  to tartrate to 25 mg twice daily due to baseline bradycardia.       Relevant Medications   aspirin EC 81 MG tablet   metoprolol  tartrate (LOPRESSOR ) 25 MG tablet   Other Visit Diagnoses       Paroxysmal atrial fibrillation (HCC)        Relevant Medications   aspirin EC 81 MG tablet   metoprolol  tartrate (LOPRESSOR ) 25 MG tablet   Other Relevant Orders   LONG TERM MONITOR (3-14 DAYS)         History of Present Illness:    Tonya Baldwin is a 83 y.o. female who is being seen today for follow-up with her. PCP is Mitcheal Amy, Reymundo Caulk, MD. Last visit with our office was 07/20/2023 with Dr. Sandee Crook.  Pleasant woman with history of hypertension, hyperlipidemia, with transient episode of postop atrial fibrillation in February 2025 while undergoing surgery for partial small bowel obstruction at [facility], echocardiogram at the time with normal LVEF, mild left atrial dilatation, moderately elevated RVSP. Being evaluated by PCP for iron deficiency anemia.  Mentions follow-up blood work recently with PCP hemoglobin improved and iron supplementation was discontinued.  She mentions other than a sense of feeling tired she does not have any symptoms of chest pain or shortness of breath.  She does report intermittent short runs of palpitations which are not sustained.  Denies any blood in urine or stools. Denies any pedal edema. Denies any syncopal episodes.  Blood work from 07/25/2023 NT proBNP 279.  Past Medical History:  Diagnosis Date   Acute allergic otitis media of right ear 01/03/2023   Anemia    Asthma    Atrial fib/flutter, transient (HCC) 08/04/2023   Atrial fibrillation, transient (HCC) 07/07/2023   Dysuria 01/03/2023   Encounter for subsequent annual wellness visit (AWV) in Medicare patient 01/03/2023   Essential hypertension 04/18/2022  Hypercholesterolemia    IBS (irritable bowel syndrome)    Migraine    Osteopenia    Other fatigue 04/05/2023   Prediabetes 04/18/2022   Rectocele 09/14/2012   Restless leg syndrome    Seasonal allergies    Surgical wound infection 07/07/2023   Surgical wound present 08/04/2023    Past Surgical History:  Procedure Laterality Date   SMALL BOWEL REPAIR       Current Medications: Current Meds  Medication Sig   aspirin EC 81 MG tablet Take 81 mg by mouth daily. Swallow whole.   metoprolol  tartrate (LOPRESSOR ) 25 MG tablet Take 1 tablet (25 mg total) by mouth 2 (two) times daily.   [DISCONTINUED] apixaban  (ELIQUIS ) 5 MG TABS tablet Take 1 tablet (5 mg total) by mouth 2 (two) times daily.   [DISCONTINUED] metoprolol  tartrate (LOPRESSOR ) 50 MG tablet Take 1 tablet (50 mg total) by mouth 2 (two) times daily.     Allergies:   Plum pulp and Sulfa antibiotics   Social History   Socioeconomic History   Marital status: Widowed    Spouse name: Not on file   Number of children: Not on file   Years of education: Not on file   Highest education level: Not on file  Occupational History   Not on file  Tobacco Use   Smoking status: Never   Smokeless tobacco: Never  Vaping Use   Vaping status: Never Used  Substance and Sexual Activity   Alcohol  use: Never   Drug use: Never   Sexual activity: Never  Other Topics Concern   Not on file  Social History Narrative   ** Merged History Encounter **       Social Drivers of Corporate investment banker Strain: Not on file  Food Insecurity: Not on file  Transportation Needs: Not on file  Physical Activity: Not on file  Stress: Not on file  Social Connections: Not on file     Family History: The patient's family history includes Cancer in her maternal grandfather and maternal grandmother; Diabetes in her brother; Heart Problems in her mother. ROS:   Please see the history of present illness.    All 14 point review of systems negative except as described per history of present illness.  EKGs/Labs/Other Studies Reviewed:    The following studies were reviewed today:   EKG:       Recent Labs: 01/03/2023: ALT 14; BUN 19; Creatinine, Ser 0.93; Potassium 5.0; Sodium 142 07/25/2023: Hemoglobin 9.9; NT-Pro BNP 279; Platelets 341  Recent Lipid Panel    Component Value Date/Time   CHOL 262 (H)  01/03/2023 1100   TRIG 88 01/03/2023 1100   HDL 81 01/03/2023 1100   CHOLHDL 3.2 01/03/2023 1100   LDLCALC 166 (H) 01/03/2023 1100    Physical Exam:    VS:  BP 134/62   Pulse (!) 50   Ht 5\' 7"  (1.702 m)   Wt 184 lb (83.5 kg)   SpO2 96%   BMI 28.82 kg/m     Wt Readings from Last 3 Encounters:  09/12/23 184 lb (83.5 kg)  09/11/23 182 lb 9.6 oz (82.8 kg)  08/04/23 182 lb 12.8 oz (82.9 kg)     GENERAL:  Well nourished, well developed in no acute distress NECK: No JVD; No carotid bruits CARDIAC: RRR, S1 and S2 present, no murmurs, no rubs, no gallops CHEST:  Clear to auscultation without rales, wheezing or rhonchi  Extremities: No pitting pedal edema. Pulses bilaterally symmetric with radial  2+ and dorsalis pedis 2+ NEUROLOGIC:  Alert and oriented x 3  Medication Adjustments/Labs and Tests Ordered: Current medicines are reviewed at length with the patient today.  Concerns regarding medicines are outlined above.  Orders Placed This Encounter  Procedures   LONG TERM MONITOR (3-14 DAYS)   Meds ordered this encounter  Medications   metoprolol  tartrate (LOPRESSOR ) 25 MG tablet    Sig: Take 1 tablet (25 mg total) by mouth 2 (two) times daily.    Dispense:  180 tablet    Refill:  3    Signed, Yeny Schmoll reddy Zaim Nitta, MD, MPH, [facility]. 09/12/2023 2:48 PM    [facility]

## 2023-10-03 DIAGNOSIS — I480 Paroxysmal atrial fibrillation: Secondary | ICD-10-CM | POA: Diagnosis not present

## 2023-10-17 DIAGNOSIS — D510 Vitamin B12 deficiency anemia due to intrinsic factor deficiency: Secondary | ICD-10-CM | POA: Diagnosis not present

## 2023-10-17 DIAGNOSIS — D649 Anemia, unspecified: Secondary | ICD-10-CM | POA: Diagnosis not present

## 2023-10-24 DIAGNOSIS — D649 Anemia, unspecified: Secondary | ICD-10-CM | POA: Diagnosis not present

## 2023-10-24 DIAGNOSIS — R5383 Other fatigue: Secondary | ICD-10-CM | POA: Diagnosis not present

## 2023-10-24 DIAGNOSIS — K644 Residual hemorrhoidal skin tags: Secondary | ICD-10-CM | POA: Diagnosis not present

## 2023-10-24 DIAGNOSIS — R109 Unspecified abdominal pain: Secondary | ICD-10-CM | POA: Diagnosis not present

## 2023-10-24 DIAGNOSIS — K5900 Constipation, unspecified: Secondary | ICD-10-CM | POA: Diagnosis not present

## 2023-11-13 DIAGNOSIS — K469 Unspecified abdominal hernia without obstruction or gangrene: Secondary | ICD-10-CM | POA: Diagnosis not present

## 2023-11-13 DIAGNOSIS — R109 Unspecified abdominal pain: Secondary | ICD-10-CM | POA: Diagnosis not present

## 2023-11-15 DIAGNOSIS — K56609 Unspecified intestinal obstruction, unspecified as to partial versus complete obstruction: Secondary | ICD-10-CM | POA: Diagnosis not present

## 2023-11-15 DIAGNOSIS — B084 Enteroviral vesicular stomatitis with exanthem: Secondary | ICD-10-CM | POA: Diagnosis not present

## 2023-12-08 ENCOUNTER — Ambulatory Visit: Admitting: Internal Medicine

## 2023-12-08 ENCOUNTER — Encounter: Payer: Self-pay | Admitting: Internal Medicine

## 2023-12-08 VITALS — BP 150/92 | HR 60 | Temp 97.2°F | Resp 18 | Ht 67.5 in | Wt 183.6 lb

## 2023-12-08 DIAGNOSIS — I10 Essential (primary) hypertension: Secondary | ICD-10-CM | POA: Diagnosis not present

## 2023-12-08 DIAGNOSIS — R7303 Prediabetes: Secondary | ICD-10-CM

## 2023-12-08 DIAGNOSIS — D509 Iron deficiency anemia, unspecified: Secondary | ICD-10-CM

## 2023-12-08 DIAGNOSIS — I480 Paroxysmal atrial fibrillation: Secondary | ICD-10-CM | POA: Insufficient documentation

## 2023-12-08 HISTORY — DX: Paroxysmal atrial fibrillation: I48.0

## 2023-12-08 NOTE — Assessment & Plan Note (Signed)
We will check her HgBa1c at this time.

## 2023-12-08 NOTE — Assessment & Plan Note (Signed)
 She seems to be in sinus rhythm on exam today.  We will continue with metoprolol .  She is bruising and I told her that she can take the ASA 81mg  every other day.

## 2023-12-08 NOTE — Progress Notes (Signed)
 Office Visit  Subjective   Patient ID: Tonya Baldwin   DOB: [birth year]   Age: 83 y.o.   MRN: [id number]   Chief Complaint Chief Complaint  Patient presents with   Follow-up    3 month follow up     History of Present Illness Tonya Baldwin is a 83 yo female who returns for followup of her paroxysmal A. Fib.  Again she was hospitalized in 06/2023 for ileus vs partial small bowel obstruction.  This did worsen and became a complete small bowel obstruction.  She did go for surgery with Dr. Joesph on 06/22/2023 of lysis of adhesions and small bowel resection.  Her hospital course was complicated by acute hypoxia on 2/16 due to acute pulmonary edema and pleural effusions.  They did iv diuresis.  They noted she had mild demand ischemia with grey zone troponins.  This did resolve.  She also developed acute A. Fib on 06/26/2023 resolved with iv lopressor .  They tranitioned her home lisinopril  to lopressor .  She was sent home on ASA 81mg  daily.  She did see cardiology in followup on 09/12/2023 where they lowered her dose of metoprolol  to 25mg  BID.  Again, she had transient postoperative atrial fibrillation in February 2025 and has remained in sinus rhythm since.  Her CHA2DS2-VASc score elevated 4.  She was high risk for bleeding with recent anemia and signs of GI blood losses.  They placed a 14 day zio patch on 10/06/2023 where predominant underlying rhythm was Sinus Rhythm. First Degree AV Block was present.  She does not look like she has significant A. Fib burden so they are going to hold off on anticoagulation for now.  They may consider a loop recorder in the future.  Today, she denies any chest pain, palpitations, SOB, syncope, generalized weakness.  She does have edema in her feet at times.  The patient is a 83 year old Caucasian/White female who presents for a follow-up of hypertension.  This past year, she has multiple visits where her BP has been borderline.  She had problems with an elevated BP in 2019  where she was hospitalized.  She was started on lisinopril  but her BP was low and she became symptomatic so we stopped her BP meds.  However, she still had some elevated BP where we restarted on low dose lisinopril . He also noted a heart murmur on her this past year.  We did an ECHO and this showed some mild concentric LVH with diastolic dysfunction with an LVEF of 60-65% with mild dilatation of her LA, and mild TR.  She does check her blood pressure at home.  She states her systolic BP runs 879-859'd.  Her current medications include:  metoprolol  25mg  BID.  The patient denies any headache, visual changes, dizziness, lightheadness, chest pain, shortness of breath, weakness/numbness, and edema. She reports there have been no other symptoms noted.   The patient is a 83 year old Caucasian/White female who returns for a follow-up visit for her prediabetes.  In 12/2022, she did see my NP aor her yearly exam and they noted her A1c went from 6.2 to 6.5.  They discussed starting on metformin but her husband was sick at that time.  She tells me today that he passed away 2 months ago.   I have had discussions in the past about starting metformin vs. diet/exercise.  During her yearly exam in 12/2018 we noted that her random glucose was slightly elevated. She came in for a HgBa1c which  was 6.2% where she was prediabetic. She remains on no medications; the diabetes is being managed by dietary intervention alone. She is also walking. She specifically denies unexplained abdominal pain, nausea or vomiting. She does not routinely check blood sugars. She came in fasting today in anticipation of lab work. Her last HgbA1c was done in 03/2023 months ago and was 6.5%.      Past Medical History Past Medical History:  Diagnosis Date   Acute allergic otitis media of right ear 01/03/2023   Anemia    Asthma    Atrial fib/flutter, transient (HCC) 08/04/2023   Atrial fibrillation, transient (HCC) 07/07/2023   Dysuria 01/03/2023    Encounter for subsequent annual wellness visit (AWV) in Medicare patient 01/03/2023   Essential hypertension 04/18/2022   Hypercholesterolemia    IBS (irritable bowel syndrome)    Migraine    Osteopenia    Other fatigue 04/05/2023   Prediabetes 04/18/2022   Rectocele 09/14/2012   Restless leg syndrome    Seasonal allergies    Surgical wound infection 07/07/2023   Surgical wound present 08/04/2023     Allergies Allergies  Allergen Reactions   Plum Pulp Other (See Comments)   Sulfa Antibiotics Dermatitis    Other reaction(s): RASH     Medications  Current Outpatient Medications:    Ascorbic Acid (VITAMIN C) 100 MG tablet, Take 100 mg by mouth daily., Disp: , Rfl:    aspirin EC 81 MG tablet, Take 81 mg by mouth daily. Swallow whole., Disp: , Rfl:    lactobacillus acidophilus (BACID) TABS tablet, Take 2 tablets by mouth 3 (three) times daily., Disp: , Rfl:    magnesium citrate SOLN, Take 1 Bottle by mouth once., Disp: , Rfl:    metoprolol  tartrate (LOPRESSOR ) 25 MG tablet, Take 1 tablet (25 mg total) by mouth 2 (two) times daily., Disp: 180 tablet, Rfl: 3   vitamin B-12 (CYANOCOBALAMIN ) 100 MCG tablet, Take 100 mcg by mouth daily., Disp: , Rfl:    Review of Systems Review of Systems  Constitutional:  Negative for chills and fever.  Eyes:  Negative for blurred vision and double vision.  Respiratory:  Negative for cough, hemoptysis, shortness of breath and wheezing.   Cardiovascular:  Positive for leg swelling. Negative for chest pain and palpitations.  Gastrointestinal:  Negative for abdominal pain, blood in stool, constipation, diarrhea, melena, nausea and vomiting.  Genitourinary:  Negative for frequency and hematuria.  Musculoskeletal:  Negative for myalgias.  Skin:  Negative for itching and rash.  Neurological:  Negative for dizziness, weakness and headaches.  Endo/Heme/Allergies:  Negative for polydipsia.       Objective:    Vitals BP (!) 150/92   Pulse 60   Temp  (!) 97.2 F (36.2 C) (Temporal)   Resp 18   Ht 5' 7.5 (1.715 m)   Wt 183 lb 9.6 oz (83.3 kg)   SpO2 97%   BMI 28.33 kg/m    Physical Examination Physical Exam Constitutional:      Appearance: Normal appearance. She is not ill-appearing.  Cardiovascular:     Rate and Rhythm: Normal rate and regular rhythm.     Pulses: Normal pulses.     Heart sounds: No murmur heard.    No friction rub. No gallop.  Pulmonary:     Effort: Pulmonary effort is normal. No respiratory distress.     Breath sounds: No wheezing, rhonchi or rales.  Abdominal:     General: Bowel sounds are normal. There is no distension.  Palpations: Abdomen is soft.     Tenderness: There is no abdominal tenderness.  Musculoskeletal:     Right lower leg: No edema.     Left lower leg: No edema.  Skin:    General: Skin is warm and dry.     Findings: No rash.  Neurological:     General: No focal deficit present.     Mental Status: She is alert and oriented to person, place, and time.  Psychiatric:        Mood and Affect: Mood normal.        Behavior: Behavior normal.        Assessment & Plan:   Paroxysmal atrial fibrillation (HCC) She seems to be in sinus rhythm on exam today.  We will continue with metoprolol .  She is bruising and I told her that she can take the ASA 81mg  every other day.  Essential hypertension Her BP has been controlled over the first half of the year.  It is elevated today.  We will see what her BP is doing on her next visit.  Prediabetes We will check her HgBa1c at this time.  Anemia She had some iron deficiency anemia after her surgery.  She is not on iron at this time.  We will recheck her CBC and iron counts.    Return in about 3 months (around 03/09/2024) for annual.   Selinda Fleeta Finger, MD

## 2023-12-08 NOTE — Assessment & Plan Note (Signed)
 Her BP has been controlled over the first half of the year.  It is elevated today.  We will see what her BP is doing on her next visit.

## 2023-12-08 NOTE — Assessment & Plan Note (Signed)
 She had some iron deficiency anemia after her surgery.  She is not on iron at this time.  We will recheck her CBC and iron counts.

## 2023-12-11 LAB — CBC WITH DIFFERENTIAL/PLATELET
Basophils Absolute: 0.1 x10E3/uL (ref 0.0–0.2)
Basos: 1 %
EOS (ABSOLUTE): 0.2 x10E3/uL (ref 0.0–0.4)
Eos: 5 %
Hematocrit: 42.5 % (ref 34.0–46.6)
Hemoglobin: 13.3 g/dL (ref 11.1–15.9)
Immature Grans (Abs): 0 x10E3/uL (ref 0.0–0.1)
Immature Granulocytes: 0 %
Lymphocytes Absolute: 1.9 x10E3/uL (ref 0.7–3.1)
Lymphs: 36 %
MCH: 30 pg (ref 26.6–33.0)
MCHC: 31.3 g/dL — ABNORMAL LOW (ref 31.5–35.7)
MCV: 96 fL (ref 79–97)
Monocytes Absolute: 0.6 x10E3/uL (ref 0.1–0.9)
Monocytes: 11 %
Neutrophils Absolute: 2.5 x10E3/uL (ref 1.4–7.0)
Neutrophils: 46 %
Platelets: 273 x10E3/uL (ref 150–450)
RBC: 4.44 x10E6/uL (ref 3.77–5.28)
RDW: 14 % (ref 11.7–15.4)
WBC: 5.3 x10E3/uL (ref 3.4–10.8)

## 2023-12-11 LAB — CMP14 + ANION GAP
ALT: 14 IU/L (ref 0–32)
AST: 21 IU/L (ref 0–40)
Albumin: 4 g/dL (ref 3.7–4.7)
Alkaline Phosphatase: 83 IU/L (ref 44–121)
Anion Gap: 13 mmol/L (ref 10.0–18.0)
BUN/Creatinine Ratio: 19 (ref 12–28)
BUN: 15 mg/dL (ref 8–27)
Bilirubin Total: 0.3 mg/dL (ref 0.0–1.2)
CO2: 23 mmol/L (ref 20–29)
Calcium: 9.5 mg/dL (ref 8.7–10.3)
Chloride: 102 mmol/L (ref 96–106)
Creatinine, Ser: 0.79 mg/dL (ref 0.57–1.00)
Globulin, Total: 2.6 g/dL (ref 1.5–4.5)
Glucose: 100 mg/dL — ABNORMAL HIGH (ref 70–99)
Potassium: 4.5 mmol/L (ref 3.5–5.2)
Sodium: 138 mmol/L (ref 134–144)
Total Protein: 6.6 g/dL (ref 6.0–8.5)
eGFR: 75 mL/min/1.73 (ref >59)

## 2023-12-11 LAB — HEMOGLOBIN A1C
Est. average glucose Bld gHb Est-mCnc: 137 mg/dL
Hgb A1c MFr Bld: 6.4 % — ABNORMAL HIGH (ref 4.8–5.6)

## 2023-12-11 LAB — IRON: Iron: 113 ug/dL (ref 27–139)

## 2023-12-11 LAB — FERRITIN: Ferritin: 94 ng/mL (ref 15–150)

## 2023-12-13 DIAGNOSIS — K432 Incisional hernia without obstruction or gangrene: Secondary | ICD-10-CM | POA: Diagnosis not present

## 2023-12-14 ENCOUNTER — Ambulatory Visit: Payer: Self-pay

## 2023-12-14 NOTE — Progress Notes (Signed)
 Patient called.  Patient aware.  I have called and informed the patient  "Her labs look good.".  Pt aware.

## 2023-12-25 ENCOUNTER — Ambulatory Visit

## 2023-12-25 ENCOUNTER — Ambulatory Visit: Payer: Self-pay

## 2023-12-25 VITALS — BP 158/90 | HR 66 | Ht 67.6 in | Wt 185.6 lb

## 2023-12-25 DIAGNOSIS — I4891 Unspecified atrial fibrillation: Secondary | ICD-10-CM | POA: Diagnosis not present

## 2023-12-25 DIAGNOSIS — I10 Essential (primary) hypertension: Secondary | ICD-10-CM | POA: Diagnosis not present

## 2023-12-25 DIAGNOSIS — I480 Paroxysmal atrial fibrillation: Secondary | ICD-10-CM | POA: Diagnosis not present

## 2023-12-25 DIAGNOSIS — R600 Localized edema: Secondary | ICD-10-CM | POA: Insufficient documentation

## 2023-12-25 MED ORDER — METOPROLOL TARTRATE 25 MG PO TABS
12.5000 mg | ORAL_TABLET | Freq: Two times a day (BID) | ORAL | 3 refills | Status: AC
Start: 2023-12-25 — End: ?

## 2023-12-25 MED ORDER — LOSARTAN POTASSIUM 25 MG PO TABS: 25.0000 mg | ORAL_TABLET | Freq: Every day | ORAL | 3 refills | Status: AC

## 2023-12-25 NOTE — Assessment & Plan Note (Addendum)
 Suboptimally controlled. Previously on lisinopril . Resume losartan  25 mg once daily. Target blood pressure below 140/80 mmHg.

## 2023-12-25 NOTE — Patient Instructions (Signed)
 Medication Instructions:  Your physician has recommended you make the following change in your medication:   START: Metoprolol  tartrate 12.5 mg two times daily START: Losartan  25 mg daily  *If you need a refill on your cardiac medications before your next appointment, please call your pharmacy*  Lab Work: None If you have labs (blood work) drawn today and your tests are completely normal, you will receive your results only by: MyChart Message (if you have MyChart) OR A paper copy in the mail If you have any lab test that is abnormal or we need to change your treatment, we will call you to review the results.  Testing/Procedures: Your physician has requested that you have a lower or upper extremity venous duplex. This test is an ultrasound of the veins in the legs or arms. It looks at venous blood flow that carries blood from the heart to the legs or arms. Allow one hour for a Lower Venous exam. Allow thirty minutes for an Upper Venous exam. There are no restrictions or special instructions.  Please note: We ask at that you not bring children with you during ultrasound (echo/ vascular) testing. Due to room size and safety concerns, children are not allowed in the ultrasound rooms during exams. Our front office staff cannot provide observation of children in our lobby area while testing is being conducted. An adult accompanying a patient to their appointment will only be allowed in the ultrasound room at the discretion of the ultrasound technician under special circumstances. We apologize for any inconvenience.   Follow-Up: At [facility], you and your health needs are our priority.  As part of our continuing mission to provide you with exceptional heart care, our providers are all part of one team.  This team includes your primary Cardiologist (physician) and Advanced Practice Providers or APPs (Physician Assistants and Nurse Practitioners) who all work together to provide you with the  care you need, when you need it.  Your next appointment:   6 month(s)  Provider:   Alean Kobus, MD    We recommend signing up for the patient portal called MyChart.  Sign up information is provided on this After Visit Summary.  MyChart is used to connect with patients for Virtual Visits (Telemedicine).  Patients are able to view lab/test results, encounter notes, upcoming appointments, etc.  Non-urgent messages can be sent to your provider as well.   To learn more about what you can do with MyChart, go to [email or web address].   Other Instructions None

## 2023-12-25 NOTE — Progress Notes (Signed)
 Cardiology Consultation:    Date:  12/25/2023   ID:  Tonya Baldwin, DOB August 15, 1940, MRN [id number]  PCP:  Fleeta Valeria Mayo, MD  Cardiologist:  Alean SAUNDERS Daivion Pape, MD   Referring MD: Fleeta Valeria Mayo, MD   No chief complaint on file.    ASSESSMENT AND PLAN:   [name] 83 year old woman with history of  hypertension, hyperlipidemia, prediabetes transient postop atrial fibrillation February 2025 in the setting of small bowel obstruction, relatively slow heart rate at baseline and intermittent episodes of palpitations likely contributing to her symptoms of lightheadedness in the setting of metoprolol  tartrate use.  An echocardiogram from 06/27/2023 noted normal biventricular function LVEF 60 to 65%, moderate TR, RVSP 49 mmHg.   14-day Zio patch study from May 2025 noted no evidence of atrial fibrillation, 2 short runs of SVT with the longest episode lasting 5 beats were observed.  Average heart rate was sinus 60/min [ranging from 42 to 106 bpm.  Rare ventricular and supraventricular ectopy burden less than 1%.  Symptoms reported correlated with normal heart rate and rhythm and isolated supraventricular ectopic beats.  She mentions that she did not activate the device as frequently as she felt symptoms.  Here for follow-up visit with ongoing intermittent episodes of palpitations. Also mentions that she is anticipating surgery for ventral hernia.   Problem List Items Addressed This Visit     Essential hypertension   Suboptimally controlled. Previously on lisinopril . Resume losartan  25 mg once daily. Target blood pressure below 140/80 mmHg.       Relevant Medications   metoprolol  tartrate (LOPRESSOR ) 25 MG tablet   losartan  (COZAAR ) 25 MG tablet   Other Relevant Orders   Ambulatory referral to Cardiac Electrophysiology   VAS US  LOWER EXTREMITY VENOUS (DVT)   Atrial fibrillation, transient (HCC) - Primary   Transient perioperative atrial fibrillation in February 2025. Has remained  in sinus rhythm since. 14-day Zio patch May 2025 no A-fib. CHA2DS2-VASc score of 4. High risk for bleeding with recent anemia and signs of GI blood losses.  Continues to report intermittent symptoms of palpitations as recently as today lasting 30 minutes.  In this context recommended further evaluation with loop recorder for long-term monitoring for any significant sustained arrhythmia episodes.  She is agreeable. Will refer to EP for loop recorder placement.  Further lowered the dose of metoprolol  tartrate to 12.5 mg twice daily due to baseline slow heart rates.  If significant A-fib burden on loop recorder monitoring, will require anticoagulation for stroke prophylaxis.       Relevant Medications   metoprolol  tartrate (LOPRESSOR ) 25 MG tablet   losartan  (COZAAR ) 25 MG tablet   Other Relevant Orders   Ambulatory referral to Cardiac Electrophysiology   VAS US  LOWER EXTREMITY VENOUS (DVT)   Edema of left lower extremity   Mild unilateral left lower extremity edema nonpitting type. No significant calf tenderness. She is ambulatory relatively low suspicion for DVT. Mentions this has been present for many months notably since her valve surgery in February 2025.  Will proceed with venous duplex study of left lower extremity to rule out DVT.       Relevant Orders   Ambulatory referral to Cardiac Electrophysiology   VAS US  LOWER EXTREMITY VENOUS (DVT)   Return to clinic tentatively in 6 months.   History of Present Illness:    Tonya Baldwin is a 83 y.o. female who is being seen today for follow-up visit. PCP is Fleeta Valeria Mayo, MD. Last visit  with me in the office was 09/12/2023.  Pleasant woman here for the visit by herself.  Lives in an in-law suite by her daughter's house since her husband passed away last year.  Has a history of hypertension, hyperlipidemia, prediabetes transient postop atrial fibrillation February 2025 in the setting of small bowel obstruction, relatively  slow heart rate at baseline and intermittent episodes of palpitations likely contributing to her symptoms of lightheadedness in the setting of metoprolol  tartrate use.  An echocardiogram from 06/27/2023 noted normal biventricular function LVEF 60 to 65%, moderate TR, RVSP 49 mmHg. Metoprolol  dose was reduced and Zio patch for 14 days requested.  Zio patch 14 days study from 09/12/2023 noted predominantly sinus rhythm with average heart rate 60/min [ranging from 42 to 106 bpm].  Rare supraventricular and ventricular ectopy burden less than 1%.  2 short runs of SVT noted with the longest episode lasting 5 beats.  Patient triggered events and diary entries total 6, correlated with normal heart rate and rhythm and at times with isolated supraventricular ectopic beats.  No high-grade conduction abnormalities or pauses.  Slowest heart rate noted was sinus bradycardia heart rate 42/min.  No evidence of atrial fibrillation. She mentions that she has not activated device as frequently as she had symptoms as she was occupied with some issues at home.  Mentions she continues to feel palpitations intermittently and as recently as this morning she had a 30-minute episode where she felt her heart was racing and associated with mild sweating.  Denies any syncopal episodes or near syncopal episodes Denies any chest pain or shortness of breath.  Mentions mild swelling in left lower extremity in comparison to right which she feels has been present for many months without any significant worsening.  Mentions she has been taking medications consistently.  Reports she is anticipating surgery for ventral hernia.  Blood work from 12/08/2023 noted BUN 15, creatinine 0.79. Normal transaminases and alkaline phosphatase Sodium 138 and potassium 4.5. Hemoglobin 13.3 and hematocrit 42.5, platelets 273.   Past Medical History:  Diagnosis Date   Acute allergic otitis media of right ear 01/03/2023   Anemia    Asthma     Atrial fib/flutter, transient (HCC) 08/04/2023   Atrial fibrillation, transient (HCC) 07/07/2023   Dysuria 01/03/2023   Encounter for subsequent annual wellness visit (AWV) in Medicare patient 01/03/2023   Essential hypertension 04/18/2022   Hypercholesterolemia    IBS (irritable bowel syndrome)    Migraine    Osteopenia    Other fatigue 04/05/2023   Paroxysmal atrial fibrillation (HCC) 12/08/2023   Prediabetes 04/18/2022   Rectocele 09/14/2012   Restless leg syndrome    Seasonal allergies    Surgical wound infection 07/07/2023   Surgical wound present 08/04/2023    Past Surgical History:  Procedure Laterality Date   SMALL BOWEL REPAIR      Current Medications: Current Meds  Medication Sig   Ascorbic Acid (VITAMIN C) 100 MG tablet Take 100 mg by mouth daily.   aspirin EC 81 MG tablet Take 81 mg by mouth daily. Swallow whole.   lactobacillus acidophilus (BACID) TABS tablet Take 2 tablets by mouth 3 (three) times daily.   losartan  (COZAAR ) 25 MG tablet Take 1 tablet (25 mg total) by mouth daily.   magnesium citrate SOLN Take 1 Bottle by mouth once.   metoprolol  tartrate (LOPRESSOR ) 25 MG tablet Take 0.5 tablets (12.5 mg total) by mouth 2 (two) times daily.   vitamin B-12 (CYANOCOBALAMIN ) 100 MCG tablet Take 100 mcg  by mouth daily.   [DISCONTINUED] metoprolol  tartrate (LOPRESSOR ) 25 MG tablet Take 1 tablet (25 mg total) by mouth 2 (two) times daily.     Allergies:   Plum pulp and Sulfa antibiotics   Social History   Socioeconomic History   Marital status: Widowed    Spouse name: Not on file   Number of children: Not on file   Years of education: Not on file   Highest education level: Not on file  Occupational History   Not on file  Tobacco Use   Smoking status: Never   Smokeless tobacco: Never  Vaping Use   Vaping status: Never Used  Substance and Sexual Activity   Alcohol  use: Never   Drug use: Never   Sexual activity: Never  Other Topics Concern   Not on file   Social History Narrative   ** Merged History Encounter **       Social Drivers of Corporate investment banker Strain: Not on file  Food Insecurity: Not on file  Transportation Needs: Not on file  Physical Activity: Not on file  Stress: Not on file  Social Connections: Not on file     Family History: The patient's family history includes Cancer in her maternal grandfather and maternal grandmother; Diabetes in her brother; Heart Problems in her mother. ROS:   Please see the history of present illness.    All 14 point review of systems negative except as described per history of present illness.  EKGs/Labs/Other Studies Reviewed:    The following studies were reviewed today:   EKG:       Recent Labs: 07/25/2023: NT-Pro BNP 279 12/08/2023: ALT 14; BUN 15; Creatinine, Ser 0.79; Hemoglobin 13.3; Platelets 273; Potassium 4.5; Sodium 138  Recent Lipid Panel    Component Value Date/Time   CHOL 262 (H) 01/03/2023 1100   TRIG 88 01/03/2023 1100   HDL 81 01/03/2023 1100   CHOLHDL 3.2 01/03/2023 1100   LDLCALC 166 (H) 01/03/2023 1100    Physical Exam:    VS:  BP (!) 158/90   Pulse 66   Ht 5' 7.6 (1.717 m)   Wt 185 lb 9.6 oz (84.2 kg)   SpO2 95%   BMI 28.56 kg/m     Wt Readings from Last 3 Encounters:  12/25/23 185 lb 9.6 oz (84.2 kg)  12/08/23 183 lb 9.6 oz (83.3 kg)  09/12/23 184 lb (83.5 kg)     GENERAL:  Well nourished, well developed in no acute distress NECK: No JVD; No carotid bruits CARDIAC: RRR, S1 and S2 present, no murmurs, no rubs, no gallops CHEST:  Clear to auscultation without rales, wheezing or rhonchi  Extremities: Mild increase in size of left lower extremity from knee down to the ankle in comparison to the right side without any significant pitting pedal edema.  No calf tenderness.  Pulses bilaterally symmetric with radial 2+ and dorsalis pedis 2+ NEUROLOGIC:  Alert and oriented x 3  Medication Adjustments/Labs and Tests Ordered: Current medicines  are reviewed at length with the patient today.  Concerns regarding medicines are outlined above.  Orders Placed This Encounter  Procedures   Ambulatory referral to Cardiac Electrophysiology   VAS US  LOWER EXTREMITY VENOUS (DVT)   Meds ordered this encounter  Medications   metoprolol  tartrate (LOPRESSOR ) 25 MG tablet    Sig: Take 0.5 tablets (12.5 mg total) by mouth 2 (two) times daily.    Dispense:  90 tablet    Refill:  3  losartan  (COZAAR ) 25 MG tablet    Sig: Take 1 tablet (25 mg total) by mouth daily.    Dispense:  90 tablet    Refill:  3    Signed, Saul Dorsi reddy Vadie Principato, MD, MPH, [facility]. 12/25/2023 9:32 AM     [facility]

## 2023-12-25 NOTE — Assessment & Plan Note (Signed)
 Mild unilateral left lower extremity edema nonpitting type. No significant calf tenderness. She is ambulatory relatively low suspicion for DVT. Mentions this has been present for many months notably since her valve surgery in February 2025.  Will proceed with venous duplex study of left lower extremity to rule out DVT.

## 2023-12-25 NOTE — Assessment & Plan Note (Addendum)
 Transient perioperative atrial fibrillation in February 2025. Has remained in sinus rhythm since. 14-day Zio patch May 2025 no A-fib. CHA2DS2-VASc score of 4. High risk for bleeding with recent anemia and signs of GI blood losses.  Continues to report intermittent symptoms of palpitations as recently as today lasting 30 minutes.  In this context recommended further evaluation with loop recorder for long-term monitoring for any significant sustained arrhythmia episodes.  She is agreeable. Will refer to EP for loop recorder placement.  Further lowered the dose of metoprolol  tartrate to 12.5 mg twice daily due to baseline slow heart rates.  If significant A-fib burden on loop recorder monitoring, will require anticoagulation for stroke prophylaxis.

## 2023-12-26 DIAGNOSIS — K432 Incisional hernia without obstruction or gangrene: Secondary | ICD-10-CM | POA: Diagnosis not present

## 2023-12-27 ENCOUNTER — Telehealth: Payer: Self-pay

## 2023-12-27 NOTE — Telephone Encounter (Signed)
   Name: Tonya Baldwin  DOB: 1940-05-30  MRN: [id number]   Primary Cardiologist:   Chart reviewed as part of pre-operative protocol coverage. Patient was contacted 12/27/2023 in reference to pre-operative risk assessment for pending surgery as outlined below.  Tonya Baldwin was last seen on 12/25/2023 by Dr.Madireddy  .  Since that day, Tonya Baldwin has done very well. She reported that Dr. Liborio verbally cleared her during the last appointment. She has had no new symptoms since being seen last from cardiac perspective.  Therefore, based on ACC/AHA guidelines, the patient would be at acceptable risk for the planned procedure without further cardiovascular testing.   The patient was advised that if she develops new symptoms prior to surgery to contact our office to arrange for a follow-up visit, and she verbalized understanding.  I will route this recommendation to the requesting party via Epic fax function and remove from pre-op pool. Please call with questions.  Lamarr Satterfield, NP 12/27/2023, 9:36 AM

## 2023-12-27 NOTE — Telephone Encounter (Signed)
   Pre-operative Risk Assessment    Patient Name: Tonya Baldwin  DOB: [birth year] MRN: [id number]   Date of last office visit: 12/25/23 Date of next office visit: N/A   Request for Surgical Clearance    Procedure:  incisional hernia surgery  Date of Surgery:  Clearance TBD                                Surgeon:  Deward Foy, MD Surgeon's Group or Practice Name:  [facility] Phone number:  [phone] Fax number:  [phone] Attn: Roseline Argyle, CMA   Type of Clearance Requested:   - Medical    Type of Anesthesia:  General    Additional requests/questions:    Signed, Annabella LITTIE Sayres   12/27/2023, 7:10 AM

## 2024-01-03 ENCOUNTER — Ambulatory Visit

## 2024-01-03 DIAGNOSIS — I10 Essential (primary) hypertension: Secondary | ICD-10-CM

## 2024-01-03 DIAGNOSIS — R600 Localized edema: Secondary | ICD-10-CM

## 2024-01-03 DIAGNOSIS — I4891 Unspecified atrial fibrillation: Secondary | ICD-10-CM | POA: Diagnosis not present

## 2024-01-04 ENCOUNTER — Ambulatory Visit: Payer: Self-pay

## 2024-01-10 NOTE — Progress Notes (Signed)
 Sent message, via epic in basket, requesting orders in epic from Careers adviser.

## 2024-01-11 ENCOUNTER — Ambulatory Visit: Payer: Self-pay | Admitting: Surgery

## 2024-01-12 ENCOUNTER — Encounter (HOSPITAL_COMMUNITY): Payer: Self-pay

## 2024-01-12 NOTE — Progress Notes (Addendum)
 PCP - Selinda Fleeta Finger, MD LOV 12-08-23 epic Cardiologist - Madireddy, Alean, MD ARNETTA 12-25-23 epic. Clearance 12-27-23 epic   Enis Satterfield, NP  PPM/ICD -  Device Orders -  Rep Notified -   Chest x-ray -  EKG - 07-20-23 epic Stress Test -  ECHO - 06-27-23 epic Cardiac Cath -   Sleep Study -  CPAP -   Fasting Blood Sugar -  Checks Blood Sugar __0___ times a day  Blood Thinner Instructions:n/a Aspirin  Instructions:81 mg asa last dose 01-13-24  ERAS Protcol -Y PRE-SURGERY N   COVID vaccine -yes  Activity--Able to climb a flight of stairs with no CP or SOB  Anesthesia review:  One time episode of A-fib after a surgery in Feburary, HTN, Asthma, pre-DM  Patient denies shortness of breath, fever, cough and chest pain at PAT appointment   All instructions explained to the patient, with a verbal understanding of the material. Patient agrees to go over the instructions while at home for a better understanding. Patient also instructed to self quarantine after being tested for COVID-19. The opportunity to ask questions was provided.

## 2024-01-12 NOTE — Patient Instructions (Addendum)
 SURGICAL WAITING ROOM VISITATION  Patients having surgery or a procedure may have no more than 2 support people in the waiting area - these visitors may rotate.    Children under the age of 55 must have an adult with them who is not the patient.  Visitors with respiratory illnesses are discouraged from visiting and should remain at home.  If the patient needs to stay at the hospital during part of their recovery, the visitor guidelines for inpatient rooms apply. Pre-op nurse will coordinate an appropriate time for 1 support person to accompany patient in pre-op.  This support person may not rotate.    Please refer to the [facility] website for the visitor guidelines for Inpatients (after your surgery is over and you are in a regular room).       Your procedure is scheduled on: 01-19-24   Report to [facility] Main Entrance    Report to admitting at       0515  AM   Call this number if you have problems the morning of surgery [phone]   Do not eat food :After Midnight.   After Midnight you may have the following liquids until _0430 _____ AM/ DAY OF SURGERY   then nothing by mouth  Water Non-Citrus Juices (without pulp, NO RED-Apple, White grape, White cranberry) Black Coffee (NO MILK/CREAM OR CREAMERS, sugar ok)  Clear Tea (NO MILK/CREAM OR CREAMERS, sugar ok) regular and decaf                             Plain Jell-O (NO RED)                                           Fruit ices (not with fruit pulp, NO RED)                                     Popsicles (NO RED)                                                               Sports drinks like Gatorade (NO RED)                             If you have questions, please contact your surgeon's office.   FOLLOW  ANY ADDITIONAL PRE OP INSTRUCTIONS YOU RECEIVED FROM YOUR SURGEON'S OFFICE!!!     Oral Hygiene is also important to reduce your risk of infection.                                    Remember - BRUSH YOUR  TEETH THE MORNING OF SURGERY WITH YOUR REGULAR TOOTHPASTE  DENTURES WILL BE REMOVED PRIOR TO SURGERY PLEASE DO NOT APPLY Poly grip OR ADHESIVES!!!   Do NOT smoke after Midnight   Stop all vitamins and herbal supplements 7 days before surgery.   Take these medicines the morning of surgery with A SIP  OF WATER: metoprolol   DO NOT TAKE ANY ORAL DIABETIC MEDICATIONS DAY OF YOUR SURGERY  Bring CPAP mask and tubing day of surgery.                              You may not have any metal on your body including hair pins, jewelry, and body piercing             Do not wear make-up, lotions, powders, perfumes/cologne, or deodorant  Do not wear nail polish including gel and S&S, artificial/acrylic nails, or any other type of covering on natural nails including finger and toenails. If you have artificial nails, gel coating, etc. that needs to be removed by a nail salon please have this removed prior to surgery or surgery may need to be canceled/ delayed if the surgeon/ anesthesia feels like they are unable to be safely monitored.   Do not shave  48 hours prior to surgery.                Do not bring valuables to the hospital. [facility] IS NOT             RESPONSIBLE   FOR VALUABLES.   Contacts, glasses, dentures or bridgework may not be worn into surgery.   Bring small overnight bag day of surgery.   DO NOT BRING YOUR HOME MEDICATIONS TO THE HOSPITAL. PHARMACY WILL DISPENSE MEDICATIONS LISTED ON YOUR MEDICATION LIST TO YOU DURING YOUR ADMISSION IN THE HOSPITAL!    Patients discharged on the day of surgery will not be allowed to drive home.  Someone NEEDS to stay with you for the first 24 hours after anesthesia.   Special Instructions: Bring a copy of your healthcare power of attorney and living will documents the day of surgery if you haven't scanned them before.              Please read over the following fact sheets you were given: IF YOU HAVE QUESTIONS ABOUT YOUR PRE-OP INSTRUCTIONS  PLEASE CALL [phone].    If you test positive for Covid or have been in contact with anyone that has tested positive in the last 10 days please notify you surgeon.    [facility] - Preparing for Surgery Before surgery, you can play an important role.  Because skin is not sterile, your skin needs to be as free of germs as possible.  You can reduce the number of germs on your skin by washing with CHG (chlorahexidine gluconate) soap before surgery.  CHG is an antiseptic cleaner which kills germs and bonds with the skin to continue killing germs even after washing. Please DO NOT use if you have an allergy to CHG or antibacterial soaps.  If your skin becomes reddened/irritated stop using the CHG and inform your nurse when you arrive at Short Stay. Do not shave (including legs and underarms) for at least 48 hours prior to the first CHG shower.  You may shave your face/neck. Please follow these instructions carefully:  1.  Shower with CHG Soap the night before surgery and the  morning of Surgery.  2.  If you choose to wash your hair, wash your hair first as usual with your  normal  shampoo.  3.  After you shampoo, rinse your hair and body thoroughly to remove the  shampoo.  4.  Use CHG as you would any other liquid soap.  You can apply chg directly  to the skin and wash                       Gently with a scrungie or clean washcloth.  5.  Apply the CHG Soap to your body ONLY FROM THE NECK DOWN.   Do not use on face/ open                           Wound or open sores. Avoid contact with eyes, ears mouth and genitals (private parts).                       Wash face,  Genitals (private parts) with your normal soap.             6.  Wash thoroughly, paying special attention to the area where your surgery  will be performed.  7.  Thoroughly rinse your body with warm water from the neck down.  8.  DO NOT shower/wash with your normal soap after using and rinsing off  the CHG Soap.                 9.  Pat yourself dry with a clean towel.            10.  Wear clean pajamas.            11.  Place clean sheets on your bed the night of your first shower and do not  sleep with pets. Day of Surgery : Do not apply any lotions/deodorants the morning of surgery.  Please wear clean clothes to the hospital/surgery center.  FAILURE TO FOLLOW THESE INSTRUCTIONS MAY RESULT IN THE CANCELLATION OF YOUR SURGERY PATIENT SIGNATURE_________________________________  NURSE SIGNATURE__________________________________  ________________________________________________________________________

## 2024-01-17 ENCOUNTER — Encounter (HOSPITAL_COMMUNITY): Payer: Self-pay

## 2024-01-17 ENCOUNTER — Encounter (HOSPITAL_COMMUNITY)
Admission: RE | Admit: 2024-01-17 | Discharge: 2024-01-17 | Disposition: A | Discharge status: Home / Self Care | Source: Ambulatory Visit | Attending: Surgery | Admitting: Surgery

## 2024-01-17 ENCOUNTER — Other Ambulatory Visit: Payer: Self-pay

## 2024-01-17 VITALS — BP 146/86 | Temp 98.4°F | Resp 16 | Ht 67.5 in | Wt 184.0 lb

## 2024-01-17 DIAGNOSIS — I10 Essential (primary) hypertension: Secondary | ICD-10-CM | POA: Insufficient documentation

## 2024-01-17 DIAGNOSIS — Z01812 Encounter for preprocedural laboratory examination: Secondary | ICD-10-CM | POA: Insufficient documentation

## 2024-01-17 DIAGNOSIS — K432 Incisional hernia without obstruction or gangrene: Secondary | ICD-10-CM | POA: Diagnosis not present

## 2024-01-17 DIAGNOSIS — I480 Paroxysmal atrial fibrillation: Secondary | ICD-10-CM | POA: Diagnosis not present

## 2024-01-17 DIAGNOSIS — M1990 Unspecified osteoarthritis, unspecified site: Secondary | ICD-10-CM | POA: Diagnosis not present

## 2024-01-17 DIAGNOSIS — E119 Type 2 diabetes mellitus without complications: Secondary | ICD-10-CM | POA: Diagnosis not present

## 2024-01-17 HISTORY — DX: Other specified postprocedural states: Z98.890

## 2024-01-17 HISTORY — DX: Family history of other specified conditions: Z84.89

## 2024-01-17 HISTORY — DX: Personal history of urinary calculi: Z87.442

## 2024-01-17 HISTORY — DX: Nausea with vomiting, unspecified: R11.2

## 2024-01-17 HISTORY — DX: Cardiac arrhythmia, unspecified: I49.9

## 2024-01-17 HISTORY — DX: Other complications of anesthesia, initial encounter: T88.59XA

## 2024-01-17 HISTORY — DX: Unspecified osteoarthritis, unspecified site: M19.90

## 2024-01-17 LAB — BASIC METABOLIC PANEL WITH GFR
Anion gap: 10 (ref 5–15)
BUN: 12 mg/dL (ref 8–23)
CO2: 25 mmol/L (ref 22–32)
Calcium: 9.7 mg/dL (ref 8.9–10.3)
Chloride: 107 mmol/L (ref 98–111)
Creatinine, Ser: 0.78 mg/dL (ref 0.44–1.00)
GFR, Estimated: >60 mL/min (ref >60)
Glucose, Bld: 108 mg/dL — ABNORMAL HIGH (ref 70–99)
Potassium: 4.9 mmol/L (ref 3.5–5.1)
Sodium: 141 mmol/L (ref 135–145)

## 2024-01-17 LAB — CBC
HCT: 42 % (ref 36.0–46.0)
Hemoglobin: 13 g/dL (ref 12.0–15.0)
MCH: 30.4 pg (ref 26.0–34.0)
MCHC: 31 g/dL (ref 30.0–36.0)
MCV: 98.1 fL (ref 80.0–100.0)
Platelets: 270 K/uL (ref 150–400)
RBC: 4.28 MIL/uL (ref 3.87–5.11)
RDW: 14.3 % (ref 11.5–15.5)
WBC: 5 K/uL (ref 4.0–10.5)
nRBC: 0 % (ref 0.0–0.2)

## 2024-01-18 ENCOUNTER — Encounter (HOSPITAL_COMMUNITY): Payer: Self-pay

## 2024-01-18 NOTE — Progress Notes (Signed)
 Case: [id number] Date/Time: 01/19/24 0715   Procedure: REPAIR, HERNIA, VENTRAL, ROBOT-ASSISTED - ROBOTIC INCISIONAL HERNIA REPAIR WITH MESH; BILATERAL POSTERIOR RECTUS MYFASCIAL RELEASE; POSSIBLE BILATERAL TRANSVERSUS ABDOMINAL MYOFASCIAL RELEASE MESH   Anesthesia type: General   Diagnosis: Incisional hernia, without obstruction or gangrene [K43.2]   Pre-op diagnosis: INCISIONAL HERNIA   Location: [facility] ROOM 05 / [facility]   Surgeons: Stechschulte, Deward PARAS, MD       DISCUSSION: Tonya Baldwin is an 83 yo female with PMH of HTN, post op A.fib, DM (A1c 6.4), arthritis.  Prior complication from anesthesia includes PONV. She also reports having post op shivering/shaking with near syncope in the past when she was overmedicated  Patient was admitted at [facility] from 06/21/2023 until 06/28/2023 for SBO. Went to the OR on 2/13 for LOA and SBR.  Her hospital course was complicated by acute hypoxia on 2/16 due to acute pulmonary edema and pleural effusions.  They did iv diuresis.  She also developed acute A. Fib on 06/26/2023 which resolved with iv lopressor . Seen by PCP for hospital follow up and she was referred to Cardiology.  Seen by Cardiology on initially on 07/20/23. Eliquis  recommended however patient hesitant to start so event monitoring ordered which did not show A.fib. Last seen by Cardiology in clinic on 12/25/23. She reported intermittent palpitations. She was cleared for upcoming surgery:  Chart reviewed as part of pre-operative protocol coverage. Patient was contacted 12/27/2023 in reference to pre-operative risk assessment for pending surgery as outlined below.  Carnita L Cloer was last seen on 12/25/2023 by Dr.Madireddy  .  Since that day, Regina L Brodrick has done very well. She reported that Dr. Liborio verbally cleared her during the last appointment. She has had no new symptoms since being seen last from cardiac perspective.   Therefore, based on ACC/AHA guidelines, the patient would be at  acceptable risk for the planned procedure without further cardiovascular testing.   VS: BP (!) 146/86   Temp 36.9 C (Oral)   Resp 16   Ht 5' 7.5 (1.715 m)   Wt 83.5 kg   SpO2 (!) 51%   BMI 28.39 kg/m   PROVIDERS: Fleeta Valeria Mayo, MD   LABS: Labs reviewed: Acceptable for surgery. (all labs ordered are listed, but only abnormal results are displayed)  Labs Reviewed  BASIC METABOLIC PANEL WITH GFR - Abnormal; Notable for the following components:      Result Value   Glucose, Bld 108 (*)    All other components within normal limits  CBC     IMAGES:   EKG 07/20/23:  Sinus bradycardia, rate 52 Otherwise normal ECG No previous ECGs available Confirmed by Monetta Rogue  CV:  Event monitor 10/06/23:    Predominantly sinus rhythm, average heart rate 60/min [ranging from 42 to 169 bpm].   Rare supraventricular ectopy and ventricular ectopy burden, less than 1%.   2 short runs of SVT with the longest episode lasting 5 beats, asymptomatic.   No high-grade AV blocks or pauses.   Slowest heart rate correlated with sinus bradycardia 42 bpm.   No evidence of atrial fibrillation.   Patient triggered events and diary entries total 6 correlated with normal heart rate and rhythm and at times with isolated supraventricular ectopic beat.  Echo 06/27/23 [facility]):  Conclusions: LV wall thickness is normal LVEF is 60-65% Aortic valve is tri leaflet and mildly sclerotic without evidence of stenosis There is moderate tricuspid regurgitation Pulmonary artery systolic pressure is moderately elevated ( )  Past Medical History:  Diagnosis Date   Acute allergic otitis media of right ear 01/03/2023   Anemia    Arthritis    Asthma    Allery related went gluten free and allergies went away   Atrial fib/flutter, transient (HCC) 08/04/2023   Atrial fibrillation, transient (HCC) 07/07/2023   Complication of anesthesia    one time she was given to much and she had a seizure  and passed out   Dysrhythmia    A-fib   Dysuria 01/03/2023   Encounter for subsequent annual wellness visit (AWV) in Medicare patient 01/03/2023   Essential hypertension 04/18/2022   Family history of adverse reaction to anesthesia    History of kidney stones    Hypercholesterolemia    IBS (irritable bowel syndrome)    Migraine    Osteopenia    Other fatigue 04/05/2023   Paroxysmal atrial fibrillation (HCC) 12/08/2023   PONV (postoperative nausea and vomiting)    Prediabetes 04/18/2022   Rectocele 09/14/2012   Restless leg syndrome    Seasonal allergies    Surgical wound infection 07/07/2023   Surgical wound present 08/04/2023    Past Surgical History:  Procedure Laterality Date   ABDOMINAL HYSTERECTOMY     APPENDECTOMY     bladder tack     CHOLECYSTECTOMY     EYE SURGERY     cataract   SMALL BOWEL REPAIR     06-22-23   TONSILLECTOMY      MEDICATIONS:  Ascorbic Acid (VITAMIN C PO)   aspirin  EC 81 MG tablet   BIOTIN PO   carboxymethylcellulose (REFRESH PLUS) 0.5 % SOLN   Cholecalciferol (VITAMIN D3 PO)   Cyanocobalamin  (VITAMIN B-12 PO)   losartan  (COZAAR ) 25 MG tablet   MAGNESIUM CITRATE PO   metoprolol  tartrate (LOPRESSOR ) 25 MG tablet   Multiple Vitamins-Minerals (MEGAVITE FRUITS & VEGGIES PO)   PREBIOTIC PRODUCT PO   Probiotic Product (PROBIOTIC PO)   No current facility-administered medications for this encounter.    Burnard CHRISTELLA Odis DEVONNA [facility] Short Stay/Anesthesiology [facility] Phone [phone] 01/18/2024 9:22 AM

## 2024-01-18 NOTE — Anesthesia Preprocedure Evaluation (Addendum)
 Anesthesia Evaluation  Patient identified by MRN, date of birth, ID band Patient awake    Reviewed: Allergy & Precautions, NPO status , Patient's Chart, lab work & pertinent test results, reviewed documented beta blocker date and time   History of Anesthesia Complications (+) PONV and history of anesthetic complications  Airway Mallampati: III  TM Distance: >3 FB Neck ROM: Full    Dental  (+) Dental Advisory Given, Partial Lower   Pulmonary asthma    Pulmonary exam normal        Cardiovascular hypertension, Pt. on medications and Pt. on home beta blockers pulmonary hypertensionNormal cardiovascular exam+ dysrhythmias Atrial Fibrillation    '25 TTE - EF 60-65%. Mild MR. Mildly dilated LA. Moderate pHTN. Trace PR.    Neuro/Psych  Headaches  negative psych ROS   GI/Hepatic Neg liver ROS,,, IBS    Endo/Other   Pre-DM   Renal/GU negative Renal ROS     Musculoskeletal  (+) Arthritis ,    Abdominal   Peds  Hematology negative hematology ROS (+)   Anesthesia Other Findings   Reproductive/Obstetrics                              Anesthesia Physical Anesthesia Plan  ASA: 3  Anesthesia Plan: General   Post-op Pain Management: Tylenol  PO (pre-op)*   Induction: Intravenous  PONV Risk Score and Plan: 4 or greater and Treatment may vary due to age or medical condition, Ondansetron  and Propofol  infusion  Airway Management Planned: Oral ETT  Additional Equipment: None  Intra-op Plan:   Post-operative Plan: Extubation in OR  Informed Consent: I have reviewed the patients History and Physical, chart, labs and discussed the procedure including the risks, benefits and alternatives for the proposed anesthesia with the patient or authorized representative who has indicated his/her understanding and acceptance.     Dental advisory given  Plan Discussed with: CRNA and  Anesthesiologist  Anesthesia Plan Comments: (See PAT note from 9/10)         Anesthesia Quick Evaluation

## 2024-01-19 ENCOUNTER — Other Ambulatory Visit: Payer: Self-pay

## 2024-01-19 ENCOUNTER — Encounter (HOSPITAL_COMMUNITY): Payer: Self-pay | Admitting: Medical

## 2024-01-19 ENCOUNTER — Ambulatory Visit (HOSPITAL_BASED_OUTPATIENT_CLINIC_OR_DEPARTMENT_OTHER): Payer: Self-pay | Admitting: Anesthesiology

## 2024-01-19 ENCOUNTER — Encounter (HOSPITAL_COMMUNITY)
Admission: RE | Disposition: A | Discharge status: Home / Self Care | Payer: Self-pay | Source: Home / Self Care | Attending: Surgery

## 2024-01-19 ENCOUNTER — Ambulatory Visit (HOSPITAL_COMMUNITY)
Admission: RE | Admit: 2024-01-19 | Discharge: 2024-01-20 | Disposition: A | Discharge status: Home / Self Care | Attending: Surgery | Admitting: Surgery

## 2024-01-19 ENCOUNTER — Encounter (HOSPITAL_COMMUNITY): Payer: Self-pay | Admitting: Surgery

## 2024-01-19 DIAGNOSIS — Z79899 Other long term (current) drug therapy: Secondary | ICD-10-CM | POA: Diagnosis not present

## 2024-01-19 DIAGNOSIS — K432 Incisional hernia without obstruction or gangrene: Secondary | ICD-10-CM

## 2024-01-19 DIAGNOSIS — I480 Paroxysmal atrial fibrillation: Secondary | ICD-10-CM | POA: Diagnosis not present

## 2024-01-19 DIAGNOSIS — I4891 Unspecified atrial fibrillation: Secondary | ICD-10-CM | POA: Diagnosis not present

## 2024-01-19 DIAGNOSIS — R7303 Prediabetes: Secondary | ICD-10-CM | POA: Insufficient documentation

## 2024-01-19 DIAGNOSIS — I4892 Unspecified atrial flutter: Secondary | ICD-10-CM | POA: Diagnosis not present

## 2024-01-19 DIAGNOSIS — E7800 Pure hypercholesterolemia, unspecified: Secondary | ICD-10-CM | POA: Diagnosis not present

## 2024-01-19 DIAGNOSIS — I10 Essential (primary) hypertension: Secondary | ICD-10-CM | POA: Diagnosis not present

## 2024-01-19 DIAGNOSIS — K439 Ventral hernia without obstruction or gangrene: Secondary | ICD-10-CM | POA: Diagnosis present

## 2024-01-19 HISTORY — PX: XI ROBOTIC ASSISTED VENTRAL HERNIA: SHX6789

## 2024-01-19 LAB — CBC
HCT: 38.7 % (ref 36.0–46.0)
Hemoglobin: 11.9 g/dL — ABNORMAL LOW (ref 12.0–15.0)
MCH: 30.5 pg (ref 26.0–34.0)
MCHC: 30.7 g/dL (ref 30.0–36.0)
MCV: 99.2 fL (ref 80.0–100.0)
Platelets: 253 K/uL (ref 150–400)
RBC: 3.9 MIL/uL (ref 3.87–5.11)
RDW: 14.1 % (ref 11.5–15.5)
WBC: 14.2 K/uL — ABNORMAL HIGH (ref 4.0–10.5)
nRBC: 0 % (ref 0.0–0.2)

## 2024-01-19 LAB — CREATININE, SERUM
Creatinine, Ser: 0.68 mg/dL (ref 0.44–1.00)
GFR, Estimated: >60 mL/min (ref >60)

## 2024-01-19 SURGERY — REPAIR, HERNIA, VENTRAL, ROBOT-ASSISTED
Anesthesia: General

## 2024-01-19 MED ORDER — SUGAMMADEX SODIUM 200 MG/2ML IV SOLN
INTRAVENOUS | Status: AC
  Filled 2024-01-19: qty 2

## 2024-01-19 MED ORDER — LIDOCAINE HCL (PF) 2 % IJ SOLN
INTRAMUSCULAR | Status: DC | PRN
  Administered 2024-01-19: 1.5 mg/kg/h via INTRADERMAL

## 2024-01-19 MED ORDER — ENOXAPARIN SODIUM 40 MG/0.4ML IJ SOSY
40.0000 mg | PREFILLED_SYRINGE | INTRAMUSCULAR | Status: DC
  Administered 2024-01-20: 40 mg via SUBCUTANEOUS
  Filled 2024-01-19: qty 0.4

## 2024-01-19 MED ORDER — METOPROLOL TARTRATE 12.5 MG HALF TABLET
12.5000 mg | ORAL_TABLET | Freq: Two times a day (BID) | ORAL | Status: DC
  Administered 2024-01-19 – 2024-01-20 (×3): 12.5 mg via ORAL
  Filled 2024-01-19 (×3): qty 1

## 2024-01-19 MED ORDER — POLYVINYL ALCOHOL 1.4 % OP SOLN: 1.0000 [drp] | Freq: Three times a day (TID) | OPHTHALMIC | Status: DC | PRN

## 2024-01-19 MED ORDER — OXYCODONE HCL 5 MG/5ML PO SOLN: 5.0000 mg | Freq: Once | ORAL | Status: DC | PRN

## 2024-01-19 MED ORDER — OXYCODONE HCL 5 MG PO TABS
5.0000 mg | ORAL_TABLET | ORAL | Status: DC | PRN
  Administered 2024-01-19 (×2): 5 mg via ORAL
  Filled 2024-01-19 (×2): qty 1

## 2024-01-19 MED ORDER — DEXAMETHASONE SODIUM PHOSPHATE 10 MG/ML IJ SOLN
INTRAMUSCULAR | Status: AC
  Filled 2024-01-19: qty 1

## 2024-01-19 MED ORDER — ASPIRIN 81 MG PO TBEC
81.0000 mg | DELAYED_RELEASE_TABLET | Freq: Every evening | ORAL | Status: DC
  Administered 2024-01-19: 81 mg via ORAL
  Filled 2024-01-19: qty 1

## 2024-01-19 MED ORDER — PROPOFOL 1000 MG/100ML IV EMUL
INTRAVENOUS | Status: AC
  Filled 2024-01-19: qty 100

## 2024-01-19 MED ORDER — DEXAMETHASONE SODIUM PHOSPHATE 10 MG/ML IJ SOLN
INTRAMUSCULAR | Status: DC | PRN
Start: 2024-01-19 — End: 2024-01-19
  Administered 2024-01-19: 8 mg via INTRAVENOUS

## 2024-01-19 MED ORDER — OXYCODONE HCL 5 MG PO TABS: 5.0000 mg | ORAL_TABLET | Freq: Once | ORAL | Status: DC | PRN

## 2024-01-19 MED ORDER — LIDOCAINE HCL 2 % IJ SOLN
INTRAMUSCULAR | Status: AC
Start: 2024-01-19 — End: 2024-01-19
  Filled 2024-01-19: qty 20

## 2024-01-19 MED ORDER — ROCURONIUM BROMIDE 10 MG/ML (PF) SYRINGE
PREFILLED_SYRINGE | INTRAVENOUS | Status: AC
  Filled 2024-01-19: qty 10

## 2024-01-19 MED ORDER — FENTANYL CITRATE PF 50 MCG/ML IJ SOSY
PREFILLED_SYRINGE | INTRAMUSCULAR | Status: AC
  Filled 2024-01-19: qty 2

## 2024-01-19 MED ORDER — VASOPRESSIN 20 UNIT/ML IV SOLN
INTRAVENOUS | Status: AC
  Filled 2024-01-19: qty 1

## 2024-01-19 MED ORDER — METHOCARBAMOL 1000 MG/10ML IJ SOLN: 500.0000 mg | Freq: Four times a day (QID) | INTRAMUSCULAR | Status: DC | PRN

## 2024-01-19 MED ORDER — PROPOFOL 10 MG/ML IV BOLUS
INTRAVENOUS | Status: AC
  Filled 2024-01-19: qty 20

## 2024-01-19 MED ORDER — SIMETHICONE 80 MG PO CHEW: 80.0000 mg | CHEWABLE_TABLET | Freq: Four times a day (QID) | ORAL | Status: DC | PRN

## 2024-01-19 MED ORDER — HYDROMORPHONE HCL 1 MG/ML IJ SOLN
0.5000 mg | INTRAMUSCULAR | Status: DC | PRN
  Administered 2024-01-19: 0.5 mg via INTRAVENOUS
  Filled 2024-01-19: qty 0.5

## 2024-01-19 MED ORDER — FENTANYL CITRATE (PF) 100 MCG/2ML IJ SOLN
INTRAMUSCULAR | Status: AC
  Filled 2024-01-19: qty 2

## 2024-01-19 MED ORDER — ONDANSETRON HCL 4 MG/2ML IJ SOLN: 4.0000 mg | Freq: Once | INTRAMUSCULAR | Status: DC | PRN

## 2024-01-19 MED ORDER — ROCURONIUM BROMIDE 100 MG/10ML IV SOLN
INTRAVENOUS | Status: DC | PRN
  Administered 2024-01-19: 10 mg via INTRAVENOUS
  Administered 2024-01-19: 20 mg via INTRAVENOUS
  Administered 2024-01-19: 10 mg via INTRAVENOUS
  Administered 2024-01-19: 50 mg via INTRAVENOUS
  Administered 2024-01-19 (×2): 20 mg via INTRAVENOUS

## 2024-01-19 MED ORDER — ORAL CARE MOUTH RINSE: 15.0000 mL | Freq: Once | OROMUCOSAL | Status: DC

## 2024-01-19 MED ORDER — PROPOFOL 10 MG/ML IV BOLUS
INTRAVENOUS | Status: DC | PRN
  Administered 2024-01-19: 110 mg via INTRAVENOUS
  Administered 2024-01-19: 100 ug/kg/min via INTRAVENOUS

## 2024-01-19 MED ORDER — ACETAMINOPHEN 325 MG PO TABS
650.0000 mg | ORAL_TABLET | Freq: Four times a day (QID) | ORAL | Status: DC
  Administered 2024-01-19 – 2024-01-20 (×3): 650 mg via ORAL
  Filled 2024-01-19 (×3): qty 2

## 2024-01-19 MED ORDER — ONDANSETRON HCL 4 MG/2ML IJ SOLN: 4.0000 mg | Freq: Four times a day (QID) | INTRAMUSCULAR | Status: DC | PRN

## 2024-01-19 MED ORDER — ONDANSETRON HCL 4 MG/2ML IJ SOLN
INTRAMUSCULAR | Status: DC | PRN
  Administered 2024-01-19: 4 mg via INTRAVENOUS

## 2024-01-19 MED ORDER — CHLORHEXIDINE GLUCONATE 0.12 % MT SOLN: 15.0000 mL | Freq: Once | OROMUCOSAL | Status: DC

## 2024-01-19 MED ORDER — HEPARIN SODIUM (PORCINE) 5000 UNIT/ML IJ SOLN
5000.0000 [IU] | Freq: Once | INTRAMUSCULAR | Status: AC
  Administered 2024-01-19: 5000 [IU] via SUBCUTANEOUS
  Filled 2024-01-19: qty 1

## 2024-01-19 MED ORDER — PHENYLEPHRINE HCL-NACL 20-0.9 MG/250ML-% IV SOLN
INTRAVENOUS | Status: DC | PRN
  Administered 2024-01-19: 10 ug/min via INTRAVENOUS

## 2024-01-19 MED ORDER — SODIUM CHLORIDE 0.9 % IV BOLUS
500.0000 mL | Freq: Once | INTRAVENOUS | Status: AC
Start: 2024-01-20 — End: 2024-01-19
  Administered 2024-01-19: 500 mL via INTRAVENOUS

## 2024-01-19 MED ORDER — PROCHLORPERAZINE EDISYLATE 10 MG/2ML IJ SOLN: 10.0000 mg | INTRAMUSCULAR | Status: DC | PRN

## 2024-01-19 MED ORDER — PROPOFOL 500 MG/50ML IV EMUL
INTRAVENOUS | Status: AC
Start: 2024-01-19 — End: 2024-01-19
  Filled 2024-01-19: qty 50

## 2024-01-19 MED ORDER — SUGAMMADEX SODIUM 200 MG/2ML IV SOLN
INTRAVENOUS | Status: DC | PRN
  Administered 2024-01-19: 350 mg via INTRAVENOUS

## 2024-01-19 MED ORDER — BUPIVACAINE-EPINEPHRINE 0.25% -1:200000 IJ SOLN
INTRAMUSCULAR | Status: DC | PRN
  Administered 2024-01-19: 40 mL

## 2024-01-19 MED ORDER — OXYCODONE HCL 5 MG PO TABS: 10.0000 mg | ORAL_TABLET | ORAL | Status: DC | PRN

## 2024-01-19 MED ORDER — CEFAZOLIN SODIUM-DEXTROSE 2-4 GM/100ML-% IV SOLN
2.0000 g | INTRAVENOUS | Status: AC
  Administered 2024-01-19: 2 g via INTRAVENOUS
  Filled 2024-01-19: qty 100

## 2024-01-19 MED ORDER — LIDOCAINE HCL (CARDIAC) PF 100 MG/5ML IV SOSY
PREFILLED_SYRINGE | INTRAVENOUS | Status: DC | PRN
  Administered 2024-01-19: 60 mg via INTRAVENOUS

## 2024-01-19 MED ORDER — DOCUSATE SODIUM 100 MG PO CAPS
100.0000 mg | ORAL_CAPSULE | Freq: Two times a day (BID) | ORAL | Status: DC
  Administered 2024-01-19 – 2024-01-20 (×2): 100 mg via ORAL
  Filled 2024-01-19 (×2): qty 1

## 2024-01-19 MED ORDER — LOSARTAN POTASSIUM 25 MG PO TABS
25.0000 mg | ORAL_TABLET | Freq: Every day | ORAL | Status: DC
  Administered 2024-01-19 – 2024-01-20 (×2): 25 mg via ORAL
  Filled 2024-01-19 (×2): qty 1

## 2024-01-19 MED ORDER — 0.9 % SODIUM CHLORIDE (POUR BTL) OPTIME
TOPICAL | Status: DC | PRN
  Administered 2024-01-19: 1000 mL

## 2024-01-19 MED ORDER — BUPIVACAINE-EPINEPHRINE (PF) 0.25% -1:200000 IJ SOLN
INTRAMUSCULAR | Status: AC
  Filled 2024-01-19: qty 60

## 2024-01-19 MED ORDER — GABAPENTIN 300 MG PO CAPS
300.0000 mg | ORAL_CAPSULE | Freq: Three times a day (TID) | ORAL | Status: DC
Start: 2024-01-19 — End: 2024-01-20
  Administered 2024-01-19 – 2024-01-20 (×3): 300 mg via ORAL
  Filled 2024-01-19: qty 3
  Filled 2024-01-19 (×2): qty 1

## 2024-01-19 MED ORDER — ACETAMINOPHEN 500 MG PO TABS
1000.0000 mg | ORAL_TABLET | Freq: Once | ORAL | Status: AC
  Administered 2024-01-19: 1000 mg via ORAL
  Filled 2024-01-19: qty 2

## 2024-01-19 MED ORDER — ROCURONIUM BROMIDE 10 MG/ML (PF) SYRINGE
PREFILLED_SYRINGE | INTRAVENOUS | Status: AC
Start: 2024-01-19 — End: 2024-01-19
  Filled 2024-01-19: qty 10

## 2024-01-19 MED ORDER — OXYCODONE-ACETAMINOPHEN 5-325 MG PO TABS: 1.0000 | ORAL_TABLET | ORAL | 0 refills | Status: DC | PRN

## 2024-01-19 MED ORDER — PROPOFOL 500 MG/50ML IV EMUL
INTRAVENOUS | Status: AC
  Filled 2024-01-19: qty 50

## 2024-01-19 MED ORDER — FENTANYL CITRATE (PF) 100 MCG/2ML IJ SOLN
INTRAMUSCULAR | Status: DC | PRN
  Administered 2024-01-19: 50 ug via INTRAVENOUS
  Administered 2024-01-19: 25 ug via INTRAVENOUS
  Administered 2024-01-19: 50 ug via INTRAVENOUS
  Administered 2024-01-19: 25 ug via INTRAVENOUS
  Administered 2024-01-19: 50 ug via INTRAVENOUS

## 2024-01-19 MED ORDER — CHLORHEXIDINE GLUCONATE CLOTH 2 % EX PADS: 6.0000 | MEDICATED_PAD | Freq: Once | CUTANEOUS | Status: DC

## 2024-01-19 MED ORDER — LACTATED RINGERS IV SOLN: INTRAVENOUS | Status: DC

## 2024-01-19 MED ORDER — FENTANYL CITRATE PF 50 MCG/ML IJ SOSY
25.0000 ug | PREFILLED_SYRINGE | INTRAMUSCULAR | Status: DC | PRN
  Administered 2024-01-19 (×2): 25 ug via INTRAVENOUS

## 2024-01-19 MED ORDER — CHLORHEXIDINE GLUCONATE CLOTH 2 % EX PADS
6.0000 | MEDICATED_PAD | Freq: Once | CUTANEOUS | Status: DC
Start: 2024-01-19 — End: 2024-01-19

## 2024-01-19 MED ORDER — ONDANSETRON HCL 4 MG/2ML IJ SOLN
INTRAMUSCULAR | Status: AC
  Filled 2024-01-19: qty 2

## 2024-01-19 MED ORDER — LIDOCAINE HCL (PF) 2 % IJ SOLN
INTRAMUSCULAR | Status: AC
  Filled 2024-01-19: qty 5

## 2024-01-19 SURGICAL SUPPLY — 54 items
BAG COUNTER SPONGE SURGICOUNT (BAG) ×1 IMPLANT
BLADE SURG SZ11 CARB STEEL (BLADE) ×1 IMPLANT
CHLORAPREP W/TINT 26 (MISCELLANEOUS) ×1 IMPLANT
COVER MAYO STAND STRL (DRAPES) ×1 IMPLANT
COVER SURGICAL LIGHT HANDLE (MISCELLANEOUS) ×1 IMPLANT
COVER TIP SHEARS 8 DVNC (MISCELLANEOUS) ×1 IMPLANT
DERMABOND ADVANCED .7 DNX12 (GAUZE/BANDAGES/DRESSINGS) IMPLANT
DEVICE TROCAR PUNCTURE CLOSURE (ENDOMECHANICALS) IMPLANT
DRAPE ARM DVNC X/XI (DISPOSABLE) ×3 IMPLANT
DRAPE COLUMN DVNC XI (DISPOSABLE) ×1 IMPLANT
DRIVER NDL LRG 8 DVNC XI (INSTRUMENTS) ×2 IMPLANT
DRIVER NDL MEGA SUTCUT DVNCXI (INSTRUMENTS) ×2 IMPLANT
DRIVER NDLE LRG 8 DVNC XI (INSTRUMENTS) ×2 IMPLANT
DRIVER NDLE MEGA SUTCUT DVNCXI (INSTRUMENTS) ×2 IMPLANT
ELECT PENCIL ROCKER SW 15FT (MISCELLANEOUS) ×1 IMPLANT
ELECT REM PT RETURN 15FT ADLT (MISCELLANEOUS) ×1 IMPLANT
ELECTRODE L-HOOK LAP 45CM DISP (ELECTROSURGICAL) ×1 IMPLANT
FORCEPS PROGRASP DVNC XI (FORCEP) ×1 IMPLANT
GLOVE BIO SURGEON STRL SZ7.5 (GLOVE) ×2 IMPLANT
GLOVE INDICATOR 8.0 STRL GRN (GLOVE) ×2 IMPLANT
GOWN STRL REUS W/ TWL XL LVL3 (GOWN DISPOSABLE) ×2 IMPLANT
GRASPER SUT TROCAR 14GX15 (MISCELLANEOUS) IMPLANT
GRASPER TIP-UP FEN DVNC XI (INSTRUMENTS) ×1 IMPLANT
IRRIGATION SUCT STRKRFLW 2 WTP (MISCELLANEOUS) IMPLANT
KIT BASIN OR (CUSTOM PROCEDURE TRAY) ×1 IMPLANT
KIT TURNOVER KIT A (KITS) ×1 IMPLANT
MANIFOLD NEPTUNE II (INSTRUMENTS) ×1 IMPLANT
MARKER SKIN DUAL TIP RULER LAB (MISCELLANEOUS) ×1 IMPLANT
MESH SOFT 12X12IN BARD (Mesh General) IMPLANT
NDL SPNL 18GX3.5 QUINCKE PK (NEEDLE) ×1 IMPLANT
NEEDLE SPNL 18GX3.5 QUINCKE PK (NEEDLE) ×1 IMPLANT
OBTURATOR OPTICALSTD 8 DVNC (TROCAR) ×1 IMPLANT
PACK CARDIOVASCULAR III (CUSTOM PROCEDURE TRAY) ×1 IMPLANT
SCISSORS MNPLR CVD DVNC XI (INSTRUMENTS) ×1 IMPLANT
SEAL UNIV 5-12 XI (MISCELLANEOUS) ×3 IMPLANT
SET TUBE SMOKE EVAC HIGH FLOW (TUBING) ×1 IMPLANT
SOLUTION ANTFG W/FOAM PAD STRL (MISCELLANEOUS) ×1 IMPLANT
SOLUTION ELECTROSURG ANTI STCK (MISCELLANEOUS) ×1 IMPLANT
SPIKE FLUID TRANSFER (MISCELLANEOUS) ×1 IMPLANT
SPONGE T-LAP 18X18 ~~LOC~~+RFID (SPONGE) ×1 IMPLANT
SUT ETHIBOND 0 36 GRN (SUTURE) IMPLANT
SUT MNCRL AB 4-0 PS2 18 (SUTURE) ×1 IMPLANT
SUT STRAFIX PDS 18 CTX (SUTURE) IMPLANT
SUT STRATA PDS 0 15 CT-1.5 (SUTURE) IMPLANT
SUT VIC AB 2-0 SH 27X BRD (SUTURE) IMPLANT
SUT VLOC 180 0 9IN GS21 (SUTURE) IMPLANT
SUTURE STRAFIX SYMMETRC 1-0 12 (SUTURE) IMPLANT
SUTURE STRAFIX SYMMETRC 1-0 24 (SUTURE) IMPLANT
SUTURE STRTFX SPRL PDS+ 2-0 23 (SUTURE) IMPLANT
SYR 20ML LL LF (SYRINGE) ×1 IMPLANT
TAPE STRIPS DRAPE STRL (GAUZE/BANDAGES/DRESSINGS) ×1 IMPLANT
TOWEL OR 17X26 10 PK STRL BLUE (TOWEL DISPOSABLE) ×1 IMPLANT
TROCAR ADV FIXATION 12X100MM (TROCAR) ×1 IMPLANT
TROCAR Z-THREAD FIOS 5X100MM (TROCAR) ×1 IMPLANT

## 2024-01-19 NOTE — Discharge Instructions (Signed)

## 2024-01-19 NOTE — Transfer of Care (Signed)
 Immediate Anesthesia Transfer of Care Note  Patient: Tonya Baldwin  Procedure(s) Performed: REPAIR, HERNIA, VENTRAL, ROBOT-ASSISTED  Patient Location: PACU  Anesthesia Type:General  Level of Consciousness: awake  Airway & Oxygen Therapy: Patient Spontanous Breathing and Patient connected to face mask oxygen  Post-op Assessment: Report given to RN and Post -op Vital signs reviewed and stable  Post vital signs: Reviewed and stable  Last Vitals:  Vitals Value Taken Time  BP 155/80 01/19/24 12:00  Temp    Pulse 74 01/19/24 12:01  Resp 14 01/19/24 12:01  SpO2 96 % 01/19/24 12:01  Vitals shown include unfiled device data.  Last Pain:  Vitals:   01/19/24 0601  TempSrc: Oral  PainSc: 0-No pain         Complications: No notable events documented.

## 2024-01-19 NOTE — Anesthesia Postprocedure Evaluation (Signed)
 Anesthesia Post Note  Patient: Tonya Baldwin  Procedure(s) Performed: REPAIR, HERNIA, VENTRAL, ROBOT-ASSISTED     Patient location during evaluation: PACU Anesthesia Type: General Level of consciousness: awake and alert Pain management: pain level controlled Vital Signs Assessment: post-procedure vital signs reviewed and stable Respiratory status: spontaneous breathing, nonlabored ventilation, respiratory function stable and patient connected to nasal cannula oxygen Cardiovascular status: blood pressure returned to baseline and stable Postop Assessment: no apparent nausea or vomiting Anesthetic complications: no   No notable events documented.  Last Vitals:  Vitals:   01/19/24 1215 01/19/24 1230  BP: (!) 158/74 136/77  Pulse: 68 63  Resp: 16 12  Temp:    SpO2: 96% 95%    Last Pain:  Vitals:   01/19/24 1230  TempSrc:   PainSc: Asleep                 Debby FORBES Like

## 2024-01-19 NOTE — Op Note (Signed)
 Patient: Tonya Baldwin (1940-11-26, [id number])  Date of Surgery: 01/19/2024  Preoperative Diagnosis: INCISIONAL HERNIA (13.5 cm tall x 5.3 cm wide lower midline incisional hernia defect on preoperative CT)  Postoperative Diagnosis: INCISIONAL HERNIA (13.5 cm tall x 5.3 cm wide lower midline incisional hernia defect on preoperative CT)  Surgical Procedure:  Robotic ventral hernia repair with mesh Bilateral posterior rectus myofascial release Bilateral transversus abdominis myofascial release   Operative Team Members:  Surgeons and Role:    * Rohail Klees, Deward PARAS, MD - Primary   Anesthesiologist: Lucious Debby BRAVO, MD CRNA: Carleton Garnette SAUNDERS, CRNA; Rudolpho Fonda SAUNDERS, CRNA; Belvie Valri NOVAK, CRNA   Anesthesia: General   Fluids:  Total I/O In: 1100 [I.V.:1000; IV Piggyback:100] Out: 375 [Urine:350; Blood:25]  Complications: * No complications entered in OR log *  Drains:  None  Specimen: * No specimens in log *   Disposition:  PACU - hemodynamically stable.  Plan of Care: Admit for overnight observation  Indications for Procedure: Tonya Baldwin is a 83 y.o. female who presented with 13.5 cm tall x 5.3 cm wide lower midline incisional hernia defect on preoperative CT.  I recommended a robotic incisional hernia repair with mesh, bilateral posterior rectus myofascial release, and possible bilateral transversus abdominis myofascial release. We discussed the procedure itself as well as its risk, benefits, and alternatives. After full discussion all questions answered the patient agreed to consent to proceed.    Findings:  Hernia Location: Ventral hernia location: Umbilical (M3), Infraumbilical (M4), and Suprapubic (M5) Hernia Size:  13.5 cm tall x 5.3 cm wide  Mesh Size &Type:  39 cm tall x 28 cm wide Bard Soft Mesh Mesh Position: Sublay - Retromuscular Myofascial Releases: Bilateral posterior rectus myofascial release, Bilateral transversus abdominis myofascial  release   Description of Procedure: The patient was positioned supine, moderately flexed at the umbilical level, padded and secured on the operating table.  A timeout procedure was performed.    What is described is a robotic, totally extraperitoneal retromuscular incisional hernia repair with bilateral rectus myofascial release, bilateral transversus abdominis myofascial release and retromuscular mesh placement.  Laparoscopic Portion: The retrorectus space was entered in the LEFT hypochondrium, at approximately the midclavicular line utilizing a 5 mm optical-viewing trocar.  Upon safe entry into this space, it was insufflated while performing a blunt dissection with the camera still in the optical trocar.   A rectus myofascial release was initiated laparoscopically on the LEFT side. Dissection was carried out laterally in the retromuscular plane to the edge of the rectus sheath progressively disconnecting the rectus muscle from the underlying posterior rectus sheath. Both the segmental innervation as well as the intercostal artery and vein brances to the rectus muscle were individually preserved.    During the left sided retrorectus dissection, a 12 mm trocar was placed into the lateral most edge of the retrorectus space.  With these initial trocars in position, the medial most aspect of the retrorectus plane was identified, and the posterior sheath was visualized as it inserted on the linea alba. The posterior sheath was incised with cautery entering the preperitoneal plane. A crossover was performed dissecting under the linea alba in the preperitoneal plane until the right rectus sheath was identified.  After identification of the right rectus sheath, it was incised vertically to enter the retrorectus space on the right.   A rectus myofascial release was initiated laparoscopically on the RIGHT side.  Blunt dissection was carried out laterally in the retromuscular plane to  the edge of the rectus  sheath progressively disconnecting the rectus muscle from the underlying posterior rectus sheath. Both the segmental innervation as well as the intercostal artery and vein brances to the rectus muscle were individually preserved.   At this juncture, both retrorectus planes were initially connected to each other and there was space for further trocar placement. An 8 mm robotic trocar was placed in the midclavicular line in right retrorectus space.  A 8mm robotic trocar was placed within the left rectus musculature in the upper abdomen, and not through the linea alba.  The initial 5 mm access trocar in the midclavicular line within the left retrorectus space was switched out for an 8 mm robotic trocar.   Robotic Portion: The Intuitive daVinci Xi surgical robot was docked in the standard fashion and the procedure begun from the robotic console. A Prograsp instrument and monopolar shears were used for the dissection.  The rectus myofascial release was completed robotically on the RIGHT side.  Dissection was carried down inferiorly preserving the peritoneum and the preperitoneal fat in the midline as it was gently dissected off of the overlying linea alba.  On the right side, the posterior rectus sheath was progressively disconnected from its insertion on the linea alba. This allowed for progression of the right side rectus myofascial release.  The rectus myofascial release accomplished medialization of the posterior rectus sheath towards the midline and disinsertion of the rectus muscle from its surrounding fascia, and thus its encasement in the rectus sheath, allowing for widening of the rectus muscle and transfer of the rectus flap towards the midline.  This will allow for future inset of the medial aspect of the flap for abdominal wall reconstruction.  Similarly, the rectus myofascial release was completed robotically on the LEFT side.  Dissection of the posterior rectus sheath was also progressively  disconnected from its insertion on the linea alba.  This allowed for progression of the left side rectus myofascial release.  The rectus myofascial release accomplished medialization of the posterior rectus sheath towards the midline and disinsertion of the rectus muscle from its surrounding fascia, and thus its encasement in the rectus sheath, allowing for widening of the rectus muscle and transfer of the rectus flap towards the midline.  This will allow for future inset of the medial aspect of the flap for abdominal wall reconstruction.  During the dissection of the midline the hernia defect was identified and the hernia sac was not reducible, therefore the hernia peritoneum was incised circumferentially around the edge of the hernia defect which left a defect within the peritoneum in the midline.  This defect was later closed with a running 2-0 Ethicon Stratafix Spiral PDS suture.  Both the left and the right rectus myofascial releases were performed towards the lower abdomen, past the arcuate line bilaterally.  During this dissection, the peritoneum and preperitoneal fat in the midline were further preserved below the hernia as they were dissected off of the overlying linea alba.   The hernia defect area was now visualized fully.  Tension on the peritoneal and posterior rectus fascia closure was assessed.  The goal was for a tension free closure to avoid postoperative intraparietal hernia, and sufficient retromuscular mesh overlap.  To acchieve these goals, I decided bilateral transversus abdominis release were necessary.    A transversus abdominis release (TAR) was performed on the RIGHT side.  The transversus abdominis muscle was identified deep to the posterior rectus sheath and incised vertically along its entire length, entering  the pre-peritoneal or pre-transversalis fascia plane.  This disinserted the transversus abdominis muscle from the linea semilunaris.  Since the intercostal nerves, arteries  and veins had been preserved during the rectus myofascial release portion of the procedure, they remained intact during the TAR. The peritoneum was subsequently peeled away from the underside of the divided transversus abdominis muscle.  This dissection was carried out laterally towards the retroperitoneum.  The TAR accomplished additional medialization of the posterior rectus sheath with its attached peritoneum towards the midline to allow for visceral sac closure.  The TAR also provided further offset of tension of the rectus muscle flap with additional transfer of the rectus muscle towards the midline, as it remained attached to the external and internal abdominal oblique muscles.  This will allow for future inset of the medial aspect of the flap for abdominal wall reconstruction.   A transversus abdominis release (TAR) was performed on the LEFT side.  The transversus abdominis muscle was identified deep to the posterior rectus sheath and incised vertically along its entire length, entering the pre-peritoneal or pre-transversalis fascia plane.  This disinserted the transversus abdominis muscle from the linea semilunaris.  Since the intercostal nerves, arteries and veins had been preserved during the rectus myofascial release portion of the procedure, they remained intact during the TAR. The peritoneum was subsequently peeled away from the underside of the divided transversus abdominis muscle.  This dissection was carried out laterally towards the retroperitoneum.  The TAR accomplished additional medialization of the posterior rectus sheath with its attached peritoneum towards the midline to allow for visceral sac closure.  The TAR also provided further offset of tension of the rectus muscle flap with additional transfer of the rectus muscle towards the midline, as it remained attached to the external and internal abdominal oblique muscles.  This will allow for future inset of the medial aspect of the flap for  abdominal wall reconstruction.   The hernia defect was closed utilizing a continuous, #1 Ethicon Stratafix Symmetric PDS Plus suture.  The hernia defect, and subsequently the rectus musculature, came together well for a complete abdominal wall reconstruction.  The dissected out retrorectus space was measured with a metric ruler so as to determine the size of the proposed mesh.    The robot was undocked and the laparoscope was inserted, inspecting for hemostasis.  The mesh deployment was performed laparoscopically.  Laparoscopic Portion:  A transversus abdominis plane (TAP) block was performed bilaterally with marcaine .  The anesthetic was first injected into the plane between the transversus abdominis and internal abdominal oblique muscles on the left. The TAP was repeated on the contralateral side.   A piece of Bard Soft mesh was opened and trimmed to 39 cm tall x 28 cm wide. The mesh was advanced into the retrorectus space and the mesh positioned flat against the intact posterior rectus sheaths. The mesh was not fixated as it occupied the entire retromuscular plane, and also covered all of the trocars.  The trocars were removed and the skin closed with 4-0 Monocryl subcuticular sutures and skin glue.   Deward Foy, MD General, Bariatric, & Minimally Invasive Surgery [facility], [place]

## 2024-01-19 NOTE — H&P (Signed)
 Admitting Physician: Deward PARAS Jasreet Dickie  Service: General Surgery  CC: incisional hernia  Subjective   HPI: Tonya Baldwin is an 83 y.o. female who is here for incisional hernia repair.  Past Medical History:  Diagnosis Date   Anemia    Arthritis    Asthma    Allery related went gluten free and allergies went away   Atrial fibrillation, transient (HCC) 07/07/2023   Complication of anesthesia    had shaking and episode of near syncope post op after being given too much medication   Dysuria 01/03/2023   Essential hypertension 04/18/2022   History of kidney stones    Hypercholesterolemia    IBS (irritable bowel syndrome)    Migraine    Osteopenia    Other fatigue 04/05/2023   PONV (postoperative nausea and vomiting)    Prediabetes 04/18/2022   Rectocele 09/14/2012   Restless leg syndrome    Seasonal allergies    Surgical wound infection 07/07/2023   Surgical wound present 08/04/2023    Past Surgical History:  Procedure Laterality Date   ABDOMINAL HYSTERECTOMY     APPENDECTOMY     bladder tack     CHOLECYSTECTOMY     EYE SURGERY     cataract   SMALL BOWEL REPAIR     06-22-23   TONSILLECTOMY      Family History  Problem Relation Age of Onset   Heart Problems Mother    Diabetes Brother    Cancer Maternal Grandmother    Cancer Maternal Grandfather     Social:  reports that she has never smoked. She has never used smokeless tobacco. She reports that she does not drink alcohol  and does not use drugs.  Allergies:  Allergies  Allergen Reactions   Plum Pulp Other (See Comments)   Sulfa Antibiotics Dermatitis    Other reaction(s): RASH    Medications: Current Outpatient Medications  Medication Instructions   Ascorbic Acid (VITAMIN C PO) 1,000 mg, Daily   aspirin  EC 81 mg, Every evening   BIOTIN PO 1 capsule, Every other day   carboxymethylcellulose (REFRESH PLUS) 0.5 % SOLN 1-2 drops, 3 times daily PRN   Cholecalciferol (VITAMIN D3 PO) 1 tablet,  Every morning   Cyanocobalamin  (VITAMIN B-12 PO) 1,000 mcg, Weekly   losartan  (COZAAR ) 25 mg, Oral, Daily   MAGNESIUM CITRATE PO 1 capsule, Every morning   metoprolol  tartrate (LOPRESSOR ) 12.5 mg, Oral, 2 times daily   Multiple Vitamins-Minerals (MEGAVITE FRUITS & VEGGIES PO) 2 capsules, every 72 hours   PREBIOTIC PRODUCT PO 1 capsule, Daily   Probiotic Product (PROBIOTIC PO) 1 capsule, Every morning    ROS - all of the below systems have been reviewed with the patient and positives are indicated with bold text General: chills, fever or night sweats Eyes: blurry vision or double vision ENT: epistaxis or sore throat Allergy/Immunology: itchy/watery eyes or nasal congestion Hematologic/Lymphatic: bleeding problems, blood clots or swollen lymph nodes Endocrine: temperature intolerance or unexpected weight changes Breast: new or changing breast lumps or nipple discharge Resp: cough, shortness of breath, or wheezing CV: chest pain or dyspnea on exertion GI: as per HPI GU: dysuria, trouble voiding, or hematuria MSK: joint pain or joint stiffness Neuro: TIA or stroke symptoms Derm: pruritus and skin lesion changes Psych: anxiety and depression  Objective   PE Blood pressure 134/66, pulse (!) 58, temperature 98.7 F (37.1 C), temperature source Oral, resp. rate 16, height 5' 7.5 (1.715 m), weight 81.6 kg, SpO2 94%. Constitutional: NAD; conversant;  no deformities Eyes: Moist conjunctiva; no lid lag; anicteric; PERRL Neck: Trachea midline; no thyromegaly Lungs: Normal respiratory effort; no tactile fremitus CV: RRR; no palpable thrills; no pitting edema GI: Abd lower midline scar with underlying hernia; no palpable hepatosplenomegaly MSK: Normal range of motion of extremities; no clubbing/cyanosis Psychiatric: Appropriate affect; alert and oriented x3 Lymphatic: No palpable cervical or axillary lymphadenopathy  No results found for this or any previous visit (from the past 24  hours).  Imaging Orders  No imaging studies ordered today   CT Abd/Pel 11/15/23  Concentric urinary bladder wall thickening suspicious for cystitis.  No evidence of small-bowel obstruction.  13.5 cm tall x 5.3 cm wide lower midline incisional hernia defect   Assessment and Plan   Tonya Baldwin is an 83 y.o. female with 13.5 cm tall x 5.3 cm wide lower midline incisional hernia defect on preoperative CT.  I recommended a robotic incisional hernia repair with mesh, bilateral posterior rectus myofascial release, and possible bilateral transversus abdominis myofascial release. We discussed the procedure itself as well as its risk, benefits, and alternatives. After full discussion all questions answered the patient agreed to consent to proceed.    Deward JINNY Foy, MD  [facility] Use [email or web address] to contact on call provider

## 2024-01-19 NOTE — Anesthesia Procedure Notes (Signed)
 Procedure Name: Intubation Date/Time: 01/19/2024 7:42 AM  Performed by: Belvie Valri NOVAK, CRNAPre-anesthesia Checklist: Patient identified, Emergency Drugs available, Suction available and Patient being monitored Patient Re-evaluated:Patient Re-evaluated prior to induction Oxygen Delivery Method: Circle System Utilized Preoxygenation: Pre-oxygenation with 100% oxygen Induction Type: IV induction Ventilation: Mask ventilation without difficulty Laryngoscope Size: Mac and 3 Grade View: Grade II Tube type: Oral Tube size: 7.0 mm Number of attempts: 1 Airway Equipment and Method: Stylet and Oral airway Placement Confirmation: ETT inserted through vocal cords under direct vision, positive ETCO2 and breath sounds checked- equal and bilateral Secured at: 21 cm Tube secured with: Tape Dental Injury: Teeth and Oropharynx as per pre-operative assessment

## 2024-01-20 DIAGNOSIS — K432 Incisional hernia without obstruction or gangrene: Secondary | ICD-10-CM | POA: Diagnosis not present

## 2024-01-20 LAB — BASIC METABOLIC PANEL WITH GFR
Anion gap: 9 (ref 5–15)
BUN: 15 mg/dL (ref 8–23)
CO2: 21 mmol/L — ABNORMAL LOW (ref 22–32)
Calcium: 8.3 mg/dL — ABNORMAL LOW (ref 8.9–10.3)
Chloride: 106 mmol/L (ref 98–111)
Creatinine, Ser: 0.82 mg/dL (ref 0.44–1.00)
GFR, Estimated: >60 mL/min (ref >60)
Glucose, Bld: 109 mg/dL — ABNORMAL HIGH (ref 70–99)
Potassium: 4.8 mmol/L (ref 3.5–5.1)
Sodium: 136 mmol/L (ref 135–145)

## 2024-01-20 LAB — CBC
HCT: 28.8 % — ABNORMAL LOW (ref 36.0–46.0)
Hemoglobin: 9 g/dL — ABNORMAL LOW (ref 12.0–15.0)
MCH: 31.5 pg (ref 26.0–34.0)
MCHC: 31.3 g/dL (ref 30.0–36.0)
MCV: 100.7 fL — ABNORMAL HIGH (ref 80.0–100.0)
Platelets: 214 K/uL (ref 150–400)
RBC: 2.86 MIL/uL — ABNORMAL LOW (ref 3.87–5.11)
RDW: 14.2 % (ref 11.5–15.5)
WBC: 8.6 K/uL (ref 4.0–10.5)
nRBC: 0 % (ref 0.0–0.2)

## 2024-01-20 NOTE — Plan of Care (Signed)
 Pt affect is calm and cooperative. She is states that she has some abdominal pain that was being treated with pain medication effectively. She had some hypotensive episodes, so prn medications were not given. She ambulates and voids with a standby assist. Daughter is bedside and pleasant. Physician on call stated that he was fine with her current vitals; continue to monitor for progress.

## 2024-01-20 NOTE — Discharge Summary (Signed)
 Physician Discharge Summary  Patient ID: Tonya Baldwin MRN: [id number] DOB/AGE: 09/19/1940 83 y.o.  Admit date: 01/19/2024 Discharge date: 01/20/2024  Admission Diagnoses: Ventral hernia/incisional hernia  Discharge Diagnoses:  Principal Problem:   Ventral hernia   Discharged Condition: good  Hospital Course: Patient did well with her robotic laparoscopic repair of her incisional hernia.  Postop day 1 she was mentating well, had no nausea or vomiting, had excellent pain control and was tolerating a diet.  She was discharged home postoperative day 1 in good condition.      Treatments: surgery: Robotic transversus abdominis rectus release repair with mesh of incisional hernia  Discharge Exam: Blood pressure (!) 114/57, pulse 63, temperature 98 F (36.7 C), temperature source Oral, resp. rate 18, height 5' 7.5 (1.715 m), weight 81.6 kg, SpO2 95%. General appearance: alert and cooperative Resp: clear to auscultation bilaterally Cardio: Normal sinus rhythm Incision/Wound: Port sites clean dry intact.  Abdomen soft flat nontender.  Disposition: Discharge disposition: 01-Home or Self Care       Discharge Instructions     Diet - low sodium heart healthy   Complete by: As directed    Increase activity slowly   Complete by: As directed       Allergies as of 01/20/2024       Reactions   Plum Pulp Other (See Comments)   Sulfa Antibiotics Dermatitis   Other reaction(s): RASH        Medication List     TAKE these medications    aspirin  EC 81 MG tablet Take 81 mg by mouth every evening. Swallow whole.   BIOTIN PO Take 1 capsule by mouth every other day. In the evening.   carboxymethylcellulose 0.5 % Soln Commonly known as: REFRESH PLUS Place 1-2 drops into both eyes 3 (three) times daily as needed (dry/irritated eyes.).   losartan  25 MG tablet Commonly known as: COZAAR  Take 1 tablet (25 mg total) by mouth daily.   MAGNESIUM CITRATE PO Take 1 capsule by  mouth in the morning.   MEGAVITE FRUITS & VEGGIES PO Take 2 capsules by mouth every 3 (three) days. In the afternoon   metoprolol  tartrate 25 MG tablet Commonly known as: LOPRESSOR  Take 0.5 tablets (12.5 mg total) by mouth 2 (two) times daily.   oxyCODONE -acetaminophen  5-325 MG tablet Commonly known as: Percocet Take 1 tablet by mouth every 4 (four) hours as needed for severe pain (pain score 7-10).   PREBIOTIC PRODUCT PO Take 1 capsule by mouth daily in the afternoon.   PROBIOTIC PO Take 1 capsule by mouth in the morning.   VITAMIN B-12 PO Take 1,000 mcg by mouth once a week.   VITAMIN C PO Take 1,000 mg by mouth daily in the afternoon.   VITAMIN D3 PO Take 1 tablet by mouth in the morning.        Follow-up Information     Stechschulte, Deward PARAS, MD Follow up in 4 week(s).   Specialty: Surgery Contact information: [address] [place] [address] [phone]                 Signed: Debby LABOR [name] 01/20/2024, 8:41 AM

## 2024-01-20 NOTE — Progress Notes (Signed)
   01/20/24 1148  TOC Brief Assessment  Insurance and Status Reviewed  Patient has primary care physician Yes  Home environment has been reviewed home  Prior level of function: mod independent  Prior/Current Home Services No current home services  Social Drivers of Health Review SDOH reviewed no interventions necessary  Readmission risk has been reviewed Yes  Transition of care needs no transition of care needs at this time

## 2024-01-20 NOTE — Care Management (Signed)
 RN paged on-call physician at 2300 to notify him that pt BP was 94/45. After fluid bolus, paged physician at 0211 to notify him that the pt BP was 109/48. Physician called back and stated that the vitals were fine and no order interventions are needed at the time.

## 2024-01-20 NOTE — Progress Notes (Signed)
 Abdominal binder applied. Discharge instructions were reviewed with the patient and her daughter. Questions, concerns were denied at this time.

## 2024-01-22 ENCOUNTER — Encounter (HOSPITAL_COMMUNITY): Payer: Self-pay | Admitting: Surgery

## 2024-02-23 ENCOUNTER — Ambulatory Visit: Attending: Cardiology | Admitting: Cardiology

## 2024-02-23 ENCOUNTER — Encounter: Payer: Self-pay | Admitting: Cardiology

## 2024-02-23 VITALS — BP 130/72 | HR 74 | Ht 67.5 in | Wt 184.0 lb

## 2024-02-23 DIAGNOSIS — I480 Paroxysmal atrial fibrillation: Secondary | ICD-10-CM

## 2024-02-23 DIAGNOSIS — I10 Essential (primary) hypertension: Secondary | ICD-10-CM | POA: Diagnosis not present

## 2024-02-23 NOTE — Patient Instructions (Signed)
Medication Instructions:  Your physician recommends that you continue on your current medications as directed. Please refer to the Current Medication list given to you today.  Labwork: None ordered.  Testing/Procedures: None ordered.  Follow-Up: As needed with Dr. Curt Bears    Implantable Loop Recorder Placement, Care After This sheet gives you information about how to care for yourself after your procedure. Your health care provider may also give you more specific instructions. If you have problems or questions, contact your health care provider. What can I expect after the procedure? After the procedure, it is common to have: Soreness or discomfort near the incision. Some swelling or bruising near the incision.  Follow these instructions at home: Incision care  Monitor your cardiac device site for redness, swelling, and drainage. Call the device clinic at [phone] if you experience these symptoms or fever/chills.  Keep the large square bandage on your site for 24 hours and then you may remove it yourself. Keep the steri-strips underneath in place.   You may shower after 72 hours / 3 days from your procedure with the steri-strips in place. They will usually fall off on their own, or may be removed after 10 days. Pat dry.   Avoid lotions, ointments, or perfumes over your incision until it is well-healed.  Please do not submerge in water until your site is completely healed.   Your device is MRI compatible.   Remote monitoring is used to monitor your cardiac device from home. This monitoring is scheduled every month by our office. It allows [place] to keep an eye on the function of your device to ensure it is working properly.  If your wound site starts to bleed apply pressure.    For help with the monitor please call [employer] Monitor Support Specialist directly at [phone].    If you have any questions/concerns please call the device clinic at  [phone].  Activity  Return to your normal activities.  General instructions Follow instructions from your health care provider about how to manage your implantable loop recorder and transmit the information. Learn how to activate a recording if this is necessary for your type of device. You may go through a metal detection gate, and you may let someone hold a metal detector over your chest. Show your ID card if needed. Do not have an MRI unless you check with your health care provider first. Take over-the-counter and prescription medicines only as told by your health care provider. Keep all follow-up visits as told by your health care provider. This is important. Contact a health care provider if: You have redness, swelling, or pain around your incision. You have a fever. You have pain that is not relieved by your pain medicine. You have triggered your device because of fainting (syncope) or because of a heartbeat that feels like it is racing, slow, fluttering, or skipping (palpitations). Get help right away if you have: Chest pain. Difficulty breathing. Summary After the procedure, it is common to have soreness or discomfort near the incision. Change your dressing as told by your health care provider. Follow instructions from your health care provider about how to manage your implantable loop recorder and transmit the information. Keep all follow-up visits as told by your health care provider. This is important. This information is not intended to replace advice given to you by your health care provider. Make sure you discuss any questions you have with your health care provider. Document Released: 04/06/2015 Document Revised: 06/10/2017 Document Reviewed: 06/10/2017 Elsevier  Patient Education  [employer].

## 2024-02-23 NOTE — Progress Notes (Signed)
 Electrophysiology Office Note:   Date:  02/23/2024  ID:  Tonya Baldwin, DOB 04/13/1941, MRN [id number]  Primary Cardiologist: None Primary Heart Failure: None Electrophysiologist: Dahlton Hinde Gladis Norton, MD      History of Present Illness:   Tonya Baldwin is a 83 y.o. female with h/o hypertension, hyperlipidemia, prediabetes, atrial fibrillation in the setting of SBO seen today for  for Electrophysiology evaluation of atrial fibrillation at the request of Sreedhar Madireddy.    Discussed the use of AI scribe software for clinical note transcription with the patient, who gave verbal consent to proceed.  History of Present Illness Tonya Baldwin is an 83 year old female with atrial fibrillation who presents with dizziness and shortness of breath. She was referred by her cardiologist for evaluation of atrial fibrillation and monitoring options.  She experiences dizziness and shortness of breath. She describes episodes where her heart feels like it's going to explode, accompanied by occasional pain lasting about five minutes. These episodes occur both at rest and during exertion, without specific triggers or alleviating factors.  She has a history of anemia, which she suspects might be contributing to her symptoms. She also reports a past episode of bowel blockage, during which she felt nauseated and had difficulty breathing.  She had atrial fibrillation around this time when she was in the hospital.  She felt quite poor when she was in atrial fibrillation.  Her mother died of a massive heart attack, which contributes to her concern about her heart health. She experiences occasional dizzy spells and describes a sensation akin to a 'short circuit' in her brain, which she associates with mini strokes.  No specific triggers or alleviating factors for her heart pain. Occasional dizziness and fatigue follow episodes of heart pain.    Review of systems complete and found to be negative unless listed  in HPI.   EP Information / Studies Reviewed:    EKG is not ordered today. EKG from 07/20/2023 reviewed which showed sinus rhythm        Risk Assessment/Calculations:    CHA2DS2-VASc Score =     This indicates a  % annual risk of stroke. The patient's score is based upon:              Physical Exam:   VS:  BP 130/72 (BP Location: Right Arm, Patient Position: Sitting, Cuff Size: Normal)   Pulse 74   Ht 5' 7.5 (1.715 m)   Wt 184 lb (83.5 kg)   SpO2 96%   BMI 28.39 kg/m    Wt Readings from Last 3 Encounters:  02/23/24 184 lb (83.5 kg)  01/19/24 180 lb (81.6 kg)  01/17/24 184 lb (83.5 kg)     GEN: Well nourished, well developed in no acute distress NECK: No JVD; No carotid bruits CARDIAC: Regular rate and rhythm, no murmurs, rubs, gallops RESPIRATORY:  Clear to auscultation without rales, wheezing or rhonchi  ABDOMEN: Soft, non-tender, non-distended EXTREMITIES:  No edema; No deformity   ASSESSMENT AND PLAN:    1.  Paroxysmal atrial fibrillation: Occurred in the setting of small bowel obstruction.  Recent cardiac monitor without evidence of atrial fibrillation.  She continues to have symptoms of palpitations.  She is interested in continued monitoring.  Tonya Baldwin plan for ILR implant.  Risks and benefits have been discussed.  She understands the risks and has agreed to the procedure.  2.  Hypertension: Well-controlled  Follow up with EP Team pending results of ILR   Signed, Tonya Rzasa Gladis  Inocencio, MD   SURGEON:  Tonya Evangelist Gladis Inocencio, MD     PREPROCEDURE DIAGNOSIS:  Atrial fibrillation    POSTPROCEDURE DIAGNOSIS: Atrial fibrillation     PROCEDURES:   1. Implantable loop recorder implantation    INTRODUCTION:  Tonya Baldwin presents with a history of atrial fibrillation The costs of loop recorder monitoring have been discussed with the patient. Appropriate time out was performed prior to the procedure.    DESCRIPTION OF PROCEDURE:  Informed written consent was  obtained.  The patient required no sedation for the procedure today.  Mapping over the patient's chest was performed to identify the area where electrograms were most prominent for ILR recording.  This area was found to be the left parasternal region over the 4th intercostal space. The patients left chest was therefore prepped and draped in the usual sterile fashion. The skin overlying the left parasternal region was infiltrated with lidocaine  for local analgesia.  A 0.5-cm incision was made over the left parasternal region over the 3rd intercostal space.  A subcutaneous ILR pocket was fashioned using a combination of sharp and blunt dissection.  A [employer] Reveal LINQ 2 (serial # [id number]) implantable loop recorder was then placed into the pocket  R waves were very prominent and measured 0.68mV.  Steri- Strips and a sterile dressing were then applied.  There were no early apparent complications.     CONCLUSIONS:   1. Successful implantation of a implantable loop recorder for a history of atrial fibrillation  2. No early apparent complications.   Tonya Norcia Gladis Inocencio, MD  02/23/2024 2:06 PM

## 2024-03-07 DIAGNOSIS — Z9849 Cataract extraction status, unspecified eye: Secondary | ICD-10-CM | POA: Diagnosis not present

## 2024-03-07 DIAGNOSIS — E663 Overweight: Secondary | ICD-10-CM | POA: Diagnosis not present

## 2024-03-07 DIAGNOSIS — I4891 Unspecified atrial fibrillation: Secondary | ICD-10-CM | POA: Diagnosis not present

## 2024-03-07 DIAGNOSIS — M810 Age-related osteoporosis without current pathological fracture: Secondary | ICD-10-CM | POA: Diagnosis not present

## 2024-03-07 DIAGNOSIS — I672 Cerebral atherosclerosis: Secondary | ICD-10-CM | POA: Diagnosis not present

## 2024-03-07 DIAGNOSIS — H3530 Unspecified macular degeneration: Secondary | ICD-10-CM | POA: Diagnosis not present

## 2024-03-07 DIAGNOSIS — D6869 Other thrombophilia: Secondary | ICD-10-CM | POA: Diagnosis not present

## 2024-03-07 DIAGNOSIS — Z7982 Long term (current) use of aspirin: Secondary | ICD-10-CM | POA: Diagnosis not present

## 2024-03-07 DIAGNOSIS — R7303 Prediabetes: Secondary | ICD-10-CM | POA: Diagnosis not present

## 2024-03-07 DIAGNOSIS — D509 Iron deficiency anemia, unspecified: Secondary | ICD-10-CM | POA: Diagnosis not present

## 2024-03-07 DIAGNOSIS — E785 Hyperlipidemia, unspecified: Secondary | ICD-10-CM | POA: Diagnosis not present

## 2024-03-07 DIAGNOSIS — I10 Essential (primary) hypertension: Secondary | ICD-10-CM | POA: Diagnosis not present

## 2024-03-14 ENCOUNTER — Encounter: Admitting: Internal Medicine

## 2024-03-25 ENCOUNTER — Ambulatory Visit

## 2024-03-28 ENCOUNTER — Ambulatory Visit: Attending: Cardiology

## 2024-03-28 DIAGNOSIS — I480 Paroxysmal atrial fibrillation: Secondary | ICD-10-CM

## 2024-03-28 LAB — CUP PACEART REMOTE DEVICE CHECK
Date Time Interrogation Session: 20251119234322
Implantable Pulse Generator Implant Date: 20251017

## 2024-04-01 ENCOUNTER — Ambulatory Visit: Admitting: Internal Medicine

## 2024-04-01 ENCOUNTER — Encounter: Payer: Self-pay | Admitting: Internal Medicine

## 2024-04-01 VITALS — BP 122/74 | HR 66 | Temp 97.2°F | Resp 18 | Ht 67.0 in | Wt 186.4 lb

## 2024-04-01 DIAGNOSIS — J4520 Mild intermittent asthma, uncomplicated: Secondary | ICD-10-CM | POA: Diagnosis not present

## 2024-04-01 DIAGNOSIS — G43109 Migraine with aura, not intractable, without status migrainosus: Secondary | ICD-10-CM | POA: Diagnosis not present

## 2024-04-01 DIAGNOSIS — I480 Paroxysmal atrial fibrillation: Secondary | ICD-10-CM | POA: Diagnosis not present

## 2024-04-01 DIAGNOSIS — Z872 Personal history of diseases of the skin and subcutaneous tissue: Secondary | ICD-10-CM | POA: Diagnosis not present

## 2024-04-01 DIAGNOSIS — M810 Age-related osteoporosis without current pathological fracture: Secondary | ICD-10-CM | POA: Diagnosis not present

## 2024-04-01 DIAGNOSIS — K581 Irritable bowel syndrome with constipation: Secondary | ICD-10-CM | POA: Diagnosis not present

## 2024-04-01 DIAGNOSIS — I10 Essential (primary) hypertension: Secondary | ICD-10-CM

## 2024-04-01 DIAGNOSIS — D509 Iron deficiency anemia, unspecified: Secondary | ICD-10-CM | POA: Diagnosis not present

## 2024-04-01 DIAGNOSIS — J302 Other seasonal allergic rhinitis: Secondary | ICD-10-CM | POA: Diagnosis not present

## 2024-04-01 DIAGNOSIS — R7303 Prediabetes: Secondary | ICD-10-CM | POA: Diagnosis not present

## 2024-04-01 DIAGNOSIS — G2581 Restless legs syndrome: Secondary | ICD-10-CM | POA: Insufficient documentation

## 2024-04-01 DIAGNOSIS — Z6829 Body mass index (BMI) 29.0-29.9, adult: Secondary | ICD-10-CM | POA: Insufficient documentation

## 2024-04-01 DIAGNOSIS — Z1231 Encounter for screening mammogram for malignant neoplasm of breast: Secondary | ICD-10-CM

## 2024-04-01 DIAGNOSIS — E7800 Pure hypercholesterolemia, unspecified: Secondary | ICD-10-CM

## 2024-04-01 DIAGNOSIS — D510 Vitamin B12 deficiency anemia due to intrinsic factor deficiency: Secondary | ICD-10-CM | POA: Insufficient documentation

## 2024-04-01 NOTE — Assessment & Plan Note (Signed)
 I want her to eat healthy and continue to be active.

## 2024-04-01 NOTE — Assessment & Plan Note (Signed)
 This is controlled with her diet.

## 2024-04-01 NOTE — Assessment & Plan Note (Signed)
 This is not really an issue today.

## 2024-04-01 NOTE — Assessment & Plan Note (Signed)
 This is resolved

## 2024-04-01 NOTE — Progress Notes (Signed)
 Preventive Screening-Counseling & Management    Tonya Baldwin is Tonya 84 y.o. Baldwin who presents for Medicare Annual Wellness Exam. She is due for the following health maintenance studies: mammogram and screening labs. This patient's past medical history Hypertension, Prediabetes, Allergies, Anemia, Asthma, Hyperlipidemia, IBS (irritable bowel syndrome), Migraine, Osteopenia, and Restless Leg Syndrome.   Her last eye exam was 2 years ago where she states she is having some flashing lights.  She is looking for Tonya new eye doctor.  he has had cataract surgery in the past. She did have some iron defiency anemia where they did Tonya colonoscopy and EGD in 05/2010. Her colonoscopy was normal and they want to repeat this in 2022. Her EGD in 2012 was normal as well.  She does not want to repeat Tonya colonoscopy at this time. Her last digital screening mammogram was done in 07/08/2020 and this was normal.  She states she did not repeat another mammogram as she has put this off.  She has Tonya history of hystrectomy and unilateral oopherectomy due to endometriosis and ovarian cysts. The patient is now walking for exercising with walking 30 minutes per day.  Her last DEXA scan was 05/2020 with Tonya T score -2.6 it was reccommended to follow up in 2 years. She does not wish to be screened again due to adverse reaction of medication.  She does not get yearly flu vaccines. She did have Tonya pneumovax 23 and zostavax shingles vaccine in 2014. She has not completed Prevnar 13 vaccine.  She did have one shingrix vaccine in 05/2020. She has has 3 COVID-19 vaccines with one booster.  There is no problems with depression, anxiety or memory loss. She take an aspirin  81mg  po daily.  Tonya Baldwin is Tonya 83 yo Baldwin who returns for followup of her paroxysmal Tonya. Fib.  Again she was hospitalized in 06/2023 for ileus vs partial small bowel obstruction.  This did worsen and became Tonya complete small bowel obstruction.  She did go for surgery with Dr. Joesph on  06/22/2023 of lysis of adhesions and small bowel resection.  Her hospital course was complicated by acute hypoxia on 2/16 due to acute pulmonary edema and pleural effusions.  They did iv diuresis.  They noted she had mild demand ischemia with grey zone troponins.  This did resolve.  She also developed acute Tonya. Fib on 06/26/2023 resolved with iv lopressor .  They tranitioned her home lisinopril  to lopressor .  She was sent home on ASA 81mg  daily.  An echocardiogram from 06/27/2023 noted normal biventricular function LVEF 60 to 65%, moderate TR, RVSP 49 mmHg.She did see cardiology in followup on 09/12/2023 where they lowered her dose of metoprolol  to 25mg  BID.  Again, she had transient postoperative atrial fibrillation in February 2025.  Her CHA2DS2-VASc score elevated 4.  She was high risk for bleeding with recent anemia and signs of GI blood losses.  They placed Tonya 14 day zio patch on 10/06/2023 where predominant underlying rhythm was Sinus Rhythm. First Degree AV Block was present.  She does not look like she has significant Tonya. Fib burden so they decided to hold on anticoagulation.  She had continued intermittent episodes of palpitations where she was seen by ardiology in followup on 12/25/2023.  They sent her to EP for evaluation for placement of Tonya loop recorder.  She had Tonya looper recorder placed on 02/23/2024.  On reviewing her chart, she looks like she has not had any evidence of Tonya. Fib recorded.  Today, she  denies any chest pain, palpitations, SOB, syncope, generalized weakness.    The patient was also admitted to the hospital from 01/19/2024 until 01/20/2024 where she had ventral/incisional hernia repair.    The patient is Tonya Baldwin who presents for Tonya follow-up of hypertension.  She was noted to have Tonya heart murmur on her this past year.  We did an ECHO and this showed some mild concentric LVH with diastolic dysfunction with an LVEF of 60-65% with mild dilatation of her LA, and mild TR.  She  does not check her blood pressure at home.  Her current medications include:  metoprolol  12.5mg  BID and losartan  25mg  daily.  The patient denies any headache, visual changes, dizziness, lightheadness, chest pain, shortness of breath, weakness/numbness, and edema. She reports there have been no other symptoms noted.   The patient is Tonya Baldwin who returns for Tonya follow-up visit for her prediabetes.  In 12/2022, she did see my NP aor her yearly exam and they noted her A1c went from 6.2 to 6.5.  They discussed starting on metformin but her husband was sick at that time.  She tells me today that he passed away 2 months ago.   I have had discussions in the past about starting metformin vs. diet/exercise.  During her yearly exam in 12/2018 we noted that her random glucose was slightly elevated. She came in for Tonya HgBa1c which was 6.2% where she was prediabetic. She remains on no medications; the diabetes is being managed by dietary intervention alone. She is also walking. She specifically denies unexplained abdominal pain, nausea or vomiting. She does not routinely check blood sugars. She came in fasting today in anticipation of lab work. Her last HgbA1c was done in 3 months ago and was 6.4%.   The patient also has Tonya history of pernicious anemia where she was on monthly Vit B12 injection.  She stopped the injections due to coronavirus and has been taking oral Vitamin B12 5000 mcg po daily.  She has Tonya history of anemia where in 03/2023 her HgB was 11.3 and on 07/25/2023 her HgB was 9.9.  We did iron studies on her where her iron was 26 and ferritin level was 13.  She has had iron deficiency anemia in the past in 2012 where they did Tonya colonoscopy and EGD in 05/2010. Her colonoscopy and EGD at that time was normal and they wanted to repeat this in 2022. She has told me in the past that she did not want to repeat Tonya colonoscopy at that time.  Today, she denies any BRBPR, melena, hematuria, hemoptysis or  other problems.     Tonya Baldwin returns today for routine followup on her cholesterol.  We have noted that her TC and LDL has remained elevated.  She has been on Lipitor in the past but stopped this due to nausea.  We also tried her on crestor which has now also caused her to have nausea.  She has been on pravastatin in the past but this made her nauseated as well.  Overall, she states she is doing well and is without any complaints or problems at this time. She specifically denies abdominal pain, nausea, vomiting, diarrhea, myalgias, and fatigue. She remains on dietary management.  She is fasting in anticipation for labs today.  The patient was admitted to [facility] in 2019 where she was having neck pain and was seen in the ER with Tonya systolic BP >200. They admitted  her and she was having neck pain and facial paresthesia where neurology consulted and they did Tonya CT of her head/neck and Tonya MRI of her brain which showed no acute abnormality. They felt she had likely facial paresthesia from possible neural impingement as opposed to Tonya TIA.  This has not recurred.    The patient is Tonya 83 year old Caucasian/White Baldwin, G2 P2002, post-menopausal, who returns for followup of her osteoporosis.  She has Tonya history of Tonya fracture in her left humerus . She denies Tonya family history of osteoporosis. There are no risk factors. She denies the following: smoking, diabetes mellitus, high caffeine intake, alcohol  consumption of more that 7 ounces per week, daily prednisone use, hyperthyroidism, surgical resection of her bowel, and surgical resection of her stomach. She states she exercises routinely. Tonya bone mineral density study was performed on 04/2010 and this showed Tonya tscore of -2.1 which showed osteopenia.  I repeated this again in 06/2015 and this showed Tonya tscore of -2.6 where she advanced to osteoporosis.  I did test her Vit D level and this was low and she is now on supplements.  We also set her up for prolia  injections with her  first injection in 06/2015.   She had the prolia  injection once which caused her to have myalgias and she has decided not to go on prolia .  She has never been on Tonya bisphosphonate.  I did place her on reclast  where she had her first injection on 11/2017.  The patient had side effects of flu like symptoms with fevers, chills, and myalgias and states she will not take this again.  I did Tonya repeat bone density in 06/10/2020 and this showed osteoporosis with Tonya t-score of -2.6.  We noted that there was no worsening of her bone density compared to her one done in 2017.  She does not want to be on iv reclast  or prolia  and I discussed use of Fosamax but she did not want to be treated for her osteoporosis.  She remains on Vit D.  She does not want anymore bone densities performed.    The patient is Tonya 83 year old Caucasian/White Baldwin who presents mild asthma which was diagnosed in her 69's and presents today for Tonya status visit. She states this was related to her allergies. She had spirometry done in 07/2014 and this was normal. She has seen Dr. Kozlow in allergy/immunology in the past and he had placed her on advair and albuterol HFA's but she stopped taking these. She is not having any symptoms from her asthma. See PMH for summary of pulmonary history and lab reports for status of any previous PFTs: Asthma. Comorbid conditions : none. She has no baseline symptoms of asthma. The patient's condition is controlled by medications as summarized in the medication list on the face sheet. She has no modifiable risk factors. Specifically denied complaints: worsening exertional dyspnea, increased wheezing, productive cough, pleuritic chest pain, purulent sputum, hemoptysis, fever, and nocturnal wheezing. Interval history: no healthcare visits with other providers since last seen in this office. She has Tonya history of allergies. She has seen Dr. Maurilio in the past where she was on allergy shots in the past. She has had recent allergy  testing which she states was normal. The patient has not had an asthma attack in >10 years.     The patient also has Tonya history of irritable bowel syndrome (IBS) with Tonya mixed picture with predominantly constipation.  She takes probiotics and  watches her diet.  Laxatives cause pain and cramping.  She gets occasional pain with IBS but otherwise it is controlled.   The patient did have chest pain and DOE in 2016 and they sent her for Tonya sestamibi nuclear stress test in 07/2014.  This showed no evidence of ischemia and her EF was >65%.  They wanted her to start an ECASA but she takes this interimittently.    She also has Tonya history of hidraneitis suppurativa that affect her genitals, breasts, and armpits.  She states she has realized she can control this with diet and is not having problems at this time.  She states she went gluten free and this cured it.  She states this is now resolved.  The patient has RLS at times especially when she exercises.  She states she cannot keep them still at night.  She states that walking helps this and this has improved.   She has Tonya history of migraines which she states was diagnosed as Tonya child.  Her last migraine attack was more than >8 years ago.  Her migraines start at the back of her head and radiate toward the front.  She can have auras at times.  She does have photophonophobia when her migraines.  These migraines can last 1-2 days.  Chocolate and raw onions is Tonya trigger.  The patient does not really take anything for her migraines.           Are there smokers in your home (other than you)? No  Risk Factors Current exercise habits: As above  Dietary issues discussed: None   Depression Screen (Note: if answer to either of the following is Yes, Tonya more complete depression screening is indicated)   Over the past two weeks, have you felt down, depressed or hopeless? No  Over the past two weeks, have you felt little interest or pleasure in doing things?  No  Have you lost interest or pleasure in daily life? No  Do you often feel hopeless? No  Do you cry easily over simple problems? No  Activities of Daily Living In your present state of health, do you have any difficulty performing the following activities?:  Driving? No Managing money?  No Feeding yourself? No Getting from bed to chair? No Climbing Tonya flight of stairs? No Preparing food and eating?: No Bathing or showering? No Getting dressed: No Getting to the toilet? No Using the toilet:No Moving around from place to place: No In the past year have you fallen or had Tonya near fall?:No   Hearing Difficulties: Yes Do you often ask people to speak up or repeat themselves? Yes Do you experience ringing or noises in your ears? No Do you have difficulty understanding soft or whispered voices? Yes   Do you feel that you have Tonya problem with memory? No  Do you often misplace items? No  Do you feel safe at home?  Yes  Cognitive Testing  Alert? Yes  Normal Appearance?Yes  Oriented to person? Yes  Place? Yes   Time? Yes  Recall of three objects?  Yes  Can perform simple calculations? Yes  Displays appropriate judgment?Yes  Can read the correct time from Tonya watch face?Yes  Fall Risk Prevention  Any stairs in or around the home? No  If so, are there any without handrails? No  Home free of loose throw rugs in walkways, pet beds, electrical cords, etc? Yes  Adequate lighting in your home to reduce risk of falls? Yes  Use of Tonya cane, walker or w/c? No    Time Up and Go  Was the test performed? Yes .  Length of time to ambulate 10 feet: 10 sec.   Gait slow and steady without use of assistive device    Advanced Directives have been discussed with the patient? Yes   List the Names of Other Physician/Practitioners you currently use: Patient Care Team: Fleeta Finger, Selinda, MD as PCP - General (Internal Medicine) Inocencio Soyla Lunger, MD as PCP - Electrophysiology (Cardiology)     Past Medical History:  Diagnosis Date   Anemia    Arthritis    Asthma    Allery related went gluten free and allergies went away   Atrial fibrillation, transient (HCC) 07/07/2023   Complication of anesthesia    had shaking and episode of near syncope post op after being given too much medication   Dysuria 01/03/2023   Essential hypertension 04/18/2022   History of kidney stones    Hypercholesterolemia    IBS (irritable bowel syndrome)    Migraine    Osteopenia    Other fatigue 04/05/2023   PONV (postoperative nausea and vomiting)    Prediabetes 04/18/2022   Rectocele 09/14/2012   Restless leg syndrome    Seasonal allergies    Surgical wound infection 07/07/2023   Surgical wound present 08/04/2023    Past Surgical History:  Procedure Laterality Date   ABDOMINAL HYSTERECTOMY     APPENDECTOMY     bladder tack     CHOLECYSTECTOMY     EYE SURGERY     cataract   SMALL BOWEL REPAIR     06-22-23   TONSILLECTOMY     XI ROBOTIC ASSISTED VENTRAL HERNIA N/Tonya 01/19/2024   Procedure: REPAIR, HERNIA, VENTRAL, ROBOT-ASSISTED;  Surgeon: Lyndel Deward PARAS, MD;  Location: [facility];  Service: General;  Laterality: N/Tonya;  ROBOTIC INCISIONAL HERNIA REPAIR WITH MESH; BILATERAL POSTERIOR RECTUS MYFASCIAL RELEASE; BILATERAL TRANSVERSUS ABDOMINAL MYOFASCIAL RELEASE MESH      Current Medications  Current Outpatient Medications  Medication Sig Dispense Refill   Ascorbic Acid (VITAMIN C PO) Take 1,000 mg by mouth daily in the afternoon.     aspirin  EC 81 MG tablet Take 81 mg by mouth every evening. Swallow whole.     BIOTIN PO Take 1 capsule by mouth every other day. In the evening.     carboxymethylcellulose (REFRESH PLUS) 0.5 % SOLN Place 1-2 drops into both eyes 3 (three) times daily as needed (dry/irritated eyes.).     Cholecalciferol (VITAMIN D3 PO) Take 1 tablet by mouth in the morning.     Cyanocobalamin  (VITAMIN B-12 PO) Take 1,000 mcg by mouth once Tonya week.     losartan  (COZAAR ) 25  MG tablet Take 1 tablet (25 mg total) by mouth daily. 90 tablet 3   MAGNESIUM CITRATE PO Take 1 capsule by mouth in the morning.     metoprolol  tartrate (LOPRESSOR ) 25 MG tablet Take 0.5 tablets (12.5 mg total) by mouth 2 (two) times daily. 90 tablet 3   Multiple Vitamins-Minerals (MEGAVITE FRUITS & VEGGIES PO) Take 2 capsules by mouth every 3 (three) days. In the afternoon     PREBIOTIC PRODUCT PO Take 1 capsule by mouth daily in the afternoon.     Probiotic Product (PROBIOTIC PO) Take 1 capsule by mouth in the morning.     No current facility-administered medications for this visit.    Allergies Plum pulp and Sulfa antibiotics   Social History Social History  Tobacco Use   Smoking status: Never   Smokeless tobacco: Never  Substance Use Topics   Alcohol  use: Never     Review of Systems Review of Systems  Constitutional:  Negative for chills, fever, malaise/fatigue and weight loss.  Eyes:  Negative for blurred vision and double vision.  Respiratory:  Negative for cough, hemoptysis and shortness of breath.   Cardiovascular:  Negative for chest pain, palpitations and leg swelling.  Gastrointestinal:  Positive for constipation. Negative for abdominal pain, blood in stool, diarrhea, heartburn, melena, nausea and vomiting.  Genitourinary:  Negative for frequency and hematuria.  Musculoskeletal:  Negative for myalgias.  Skin:  Positive for rash. Negative for itching.  Neurological:  Negative for dizziness, weakness and headaches.  Endo/Heme/Allergies:  Negative for polydipsia.     Physical Exam:      Body mass index is 29.19 kg/m. BP 122/74 (BP Location: Right Arm, Patient Position: Sitting, Cuff Size: Normal)   Pulse 66   Temp (!) 97.2 F (36.2 C)   Resp 18   Ht 5' 7 (1.702 m)   Wt 186 lb 6 oz (84.5 kg)   SpO2 95%   BMI 29.19 kg/m   Physical Exam   Assessment:      Paroxysmal atrial fibrillation (HCC)  Essential hypertension  Prediabetes  Iron deficiency  anemia, unspecified iron deficiency anemia type  Hypercholesterolemia  Age-related osteoporosis without current pathological fracture  Seasonal allergies  Mild intermittent asthma without complication  Migraine with aura and without status migrainosus, not intractable  Irritable bowel syndrome with constipation  RLS (restless legs syndrome)  History of hidradenitis suppurativa  Pernicious anemia  BMI 29.0-29.9,adult  Screening mammogram for breast cancer    Plan:     During the course of the visit the patient was educated and counseled about appropriate screening and preventive services including:   Pneumococcal vaccine  Influenza vaccine Screening mammography Bone densitometry screening Colorectal cancer screening Advanced directives: discussed  Diet review for nutrition referral? Yes ____  Not Indicated _X___   Patient Instructions (the written plan) was given to the patient.  Paroxysmal atrial fibrillation (HCC) I have reviewed her chart and from what I can see from her loop recorder, she is not having Tonya. Fib.  She remains on metoprolol  and she is on an ASA 81mg  daily.  She will followup with cardiology and EP as directed.  Migraine This is not really an issue today and she takes no medications for prophylaxis.  Essential hypertension Her BP is doing well. We will continue to monitor.  Asthma This is not an issue today and she is not on any inhalers.  IBS (irritable bowel syndrome) This is controlled with her diet.  Seasonal allergies She can take OTC meds as needed.  RLS (restless legs syndrome) This is not really an issue today.  Prediabetes She controls this with diet.  We will check Tonya HgBa1c today.  Pernicious anemia We will check Tonya CBC and Vit B12 today.  She does not take any Vit B12 supplements.  Hypercholesterolemia She controls this with diet.  We will check Tonya FLP today.  History of hidradenitis suppurativa This is resolved.  BMI  29.0-29.9,adult I want her to eat healthy and continue to be active.   Prevention Health maintenance discussed.  We will arrange for mammogram.  We will obtain some yearly labs.    Medicare Attestation I have personally reviewed: The patient's medical and social history Their use of alcohol , tobacco or illicit drugs Their current  medications and supplements The patient's functional ability including ADLs,fall risks, home safety risks, cognitive, and hearing and visual impairment Diet and physical activities Evidence for depression or mood disorders  The patient's weight, height, and BMI have been recorded in the chart.  I have made referrals, counseling, and provided education to the patient based on review of the above and I have provided the patient with Tonya written personalized care plan for preventive services.     Selinda Fleeta Finger, MD   04/01/2024

## 2024-04-01 NOTE — Assessment & Plan Note (Signed)
Her BP is doing well.  We will continue to monitor.

## 2024-04-01 NOTE — Assessment & Plan Note (Signed)
 I have reviewed her chart and from what I can see from her loop recorder, she is not having A. Fib.  She remains on metoprolol  and she is on an ASA 81mg  daily.  She will followup with cardiology and EP as directed.

## 2024-04-01 NOTE — Assessment & Plan Note (Signed)
 She controls this with diet.  We will check a HgBa1c today.

## 2024-04-01 NOTE — Assessment & Plan Note (Signed)
 This is not an issue today and she is not on any inhalers.

## 2024-04-01 NOTE — Assessment & Plan Note (Signed)
 She controls this with diet.  We will check a FLP today.

## 2024-04-01 NOTE — Progress Notes (Signed)
 Remote Loop Recorder Transmission

## 2024-04-01 NOTE — Assessment & Plan Note (Signed)
 She can take OTC meds as needed.

## 2024-04-01 NOTE — Assessment & Plan Note (Signed)
 This is not really an issue today and she takes no medications for prophylaxis.

## 2024-04-01 NOTE — Assessment & Plan Note (Signed)
 We will check a CBC and Vit B12 today.  She does not take any Vit B12 supplements.

## 2024-04-03 ENCOUNTER — Ambulatory Visit: Payer: Self-pay | Admitting: Cardiology

## 2024-04-08 ENCOUNTER — Ambulatory Visit

## 2024-04-08 DIAGNOSIS — D510 Vitamin B12 deficiency anemia due to intrinsic factor deficiency: Secondary | ICD-10-CM | POA: Diagnosis not present

## 2024-04-08 DIAGNOSIS — I10 Essential (primary) hypertension: Secondary | ICD-10-CM | POA: Diagnosis not present

## 2024-04-08 DIAGNOSIS — E7800 Pure hypercholesterolemia, unspecified: Secondary | ICD-10-CM | POA: Diagnosis not present

## 2024-04-08 DIAGNOSIS — R7303 Prediabetes: Secondary | ICD-10-CM | POA: Diagnosis not present

## 2024-04-08 DIAGNOSIS — D509 Iron deficiency anemia, unspecified: Secondary | ICD-10-CM | POA: Diagnosis not present

## 2024-04-09 LAB — CMP14 + ANION GAP
ALT: 15 IU/L (ref 0–32)
AST: 21 IU/L (ref 0–40)
Albumin: 4.3 g/dL (ref 3.7–4.7)
Alkaline Phosphatase: 83 IU/L (ref 48–129)
Anion Gap: 14 mmol/L (ref 10.0–18.0)
BUN/Creatinine Ratio: 14 (ref 12–28)
BUN: 13 mg/dL (ref 8–27)
Bilirubin Total: 0.3 mg/dL (ref 0.0–1.2)
CO2: 22 mmol/L (ref 20–29)
Calcium: 9.8 mg/dL (ref 8.7–10.3)
Chloride: 105 mmol/L (ref 96–106)
Creatinine, Ser: 0.9 mg/dL (ref 0.57–1.00)
Globulin, Total: 2.3 g/dL (ref 1.5–4.5)
Glucose: 111 mg/dL — ABNORMAL HIGH (ref 70–99)
Potassium: 4.7 mmol/L (ref 3.5–5.2)
Sodium: 141 mmol/L (ref 134–144)
Total Protein: 6.6 g/dL (ref 6.0–8.5)
eGFR: 64 mL/min/1.73 (ref >59)

## 2024-04-09 LAB — CBC WITH DIFFERENTIAL/PLATELET
Basophils Absolute: 0.1 x10E3/uL (ref 0.0–0.2)
Basos: 1 %
EOS (ABSOLUTE): 0.2 x10E3/uL (ref 0.0–0.4)
Eos: 4 %
Hematocrit: 43.6 % (ref 34.0–46.6)
Hemoglobin: 13.9 g/dL (ref 11.1–15.9)
Immature Grans (Abs): 0 x10E3/uL (ref 0.0–0.1)
Immature Granulocytes: 0 %
Lymphocytes Absolute: 1.7 x10E3/uL (ref 0.7–3.1)
Lymphs: 29 %
MCH: 30.6 pg (ref 26.6–33.0)
MCHC: 31.9 g/dL (ref 31.5–35.7)
MCV: 96 fL (ref 79–97)
Monocytes Absolute: 0.5 x10E3/uL (ref 0.1–0.9)
Monocytes: 9 %
Neutrophils Absolute: 3.3 x10E3/uL (ref 1.4–7.0)
Neutrophils: 56 %
Platelets: 316 x10E3/uL (ref 150–450)
RBC: 4.54 x10E6/uL (ref 3.77–5.28)
RDW: 12 % (ref 11.7–15.4)
WBC: 5.8 x10E3/uL (ref 3.4–10.8)

## 2024-04-09 LAB — LIPID PANEL
Chol/HDL Ratio: 3.3 ratio (ref 0.0–4.4)
Cholesterol, Total: 243 mg/dL — ABNORMAL HIGH (ref 100–199)
HDL: 74 mg/dL (ref >39)
LDL Chol Calc (NIH): 148 mg/dL — ABNORMAL HIGH (ref 0–99)
Triglycerides: 119 mg/dL (ref 0–149)
VLDL Cholesterol Cal: 21 mg/dL (ref 5–40)

## 2024-04-09 LAB — HEMOGLOBIN A1C
Est. average glucose Bld gHb Est-mCnc: 126 mg/dL
Hgb A1c MFr Bld: 6 % — ABNORMAL HIGH (ref 4.8–5.6)

## 2024-04-09 LAB — TSH: TSH: 1.77 u[IU]/mL (ref 0.450–4.500)

## 2024-04-09 LAB — VITAMIN B12: Vitamin B-12: 238 pg/mL (ref 232–1245)

## 2024-04-09 LAB — MAGNESIUM: Magnesium: 2.2 mg/dL (ref 1.6–2.3)

## 2024-04-23 ENCOUNTER — Ambulatory Visit

## 2024-04-23 ENCOUNTER — Ambulatory Visit: Payer: Self-pay

## 2024-04-23 NOTE — Progress Notes (Signed)
 Patient called.  Patient aware.  Per Dr. Fleeta Finger She needs to restart her Vit B12 injections monhtly with 1000mcg IM monthyly.   Her labs otherwise look good.  Her cholesterol was elevated but she has had side effects from statins.  We will hold off on treating her cholesterol. SABRA

## 2024-04-28 ENCOUNTER — Ambulatory Visit

## 2024-04-28 DIAGNOSIS — I480 Paroxysmal atrial fibrillation: Secondary | ICD-10-CM | POA: Diagnosis not present

## 2024-04-30 LAB — CUP PACEART REMOTE DEVICE CHECK
Date Time Interrogation Session: 20251220233506
Implantable Pulse Generator Implant Date: 20251017

## 2024-05-01 NOTE — Progress Notes (Signed)
 Remote Loop Recorder Transmission

## 2024-05-11 ENCOUNTER — Ambulatory Visit: Payer: Self-pay | Admitting: Cardiology

## 2024-05-13 ENCOUNTER — Other Ambulatory Visit: Payer: Self-pay

## 2024-05-13 MED ORDER — CYANOCOBALAMIN 1000 MCG/ML IJ SOLN: 1000.0000 ug | Freq: Once | INTRAMUSCULAR | 0 refills | Status: AC

## 2024-05-13 NOTE — Progress Notes (Signed)
 B12 Injections with syringes sent to [facility] in [place].

## 2024-05-29 ENCOUNTER — Ambulatory Visit

## 2024-05-29 DIAGNOSIS — I480 Paroxysmal atrial fibrillation: Secondary | ICD-10-CM | POA: Diagnosis not present

## 2024-05-29 LAB — CUP PACEART REMOTE DEVICE CHECK
Date Time Interrogation Session: 20260120234303
Implantable Pulse Generator Implant Date: 20251017

## 2024-05-30 ENCOUNTER — Ambulatory Visit: Payer: Self-pay | Admitting: Cardiology

## 2024-06-01 NOTE — Progress Notes (Signed)
 Remote Loop Recorder Transmission

## 2024-06-24 ENCOUNTER — Ambulatory Visit

## 2024-06-29 ENCOUNTER — Ambulatory Visit: Payer: Medicare (Managed Care)

## 2024-07-30 ENCOUNTER — Ambulatory Visit: Payer: Medicare (Managed Care)

## 2024-08-30 ENCOUNTER — Ambulatory Visit: Payer: Medicare (Managed Care)

## 2024-09-23 ENCOUNTER — Ambulatory Visit

## 2024-09-30 ENCOUNTER — Ambulatory Visit: Payer: Medicare (Managed Care)

## 2024-10-31 ENCOUNTER — Ambulatory Visit: Payer: Medicare (Managed Care)

## 2024-12-01 ENCOUNTER — Ambulatory Visit: Payer: Medicare (Managed Care)

## 2024-12-23 ENCOUNTER — Ambulatory Visit

## 2025-01-01 ENCOUNTER — Ambulatory Visit: Payer: Medicare (Managed Care)

## 2025-02-01 ENCOUNTER — Ambulatory Visit: Payer: Medicare (Managed Care)

## 2025-03-04 ENCOUNTER — Ambulatory Visit: Payer: Medicare (Managed Care)

## 2025-03-24 ENCOUNTER — Ambulatory Visit

## 2025-04-04 ENCOUNTER — Ambulatory Visit: Payer: Medicare (Managed Care)

## 2025-06-23 ENCOUNTER — Ambulatory Visit
